# Patient Record
Sex: Female | Born: 1940
Health system: Southern US, Community
[De-identification: ages and names within clinical notes are randomized; demographics above are authoritative.]

## PROBLEM LIST (undated history)

## (undated) DIAGNOSIS — M1990 Unspecified osteoarthritis, unspecified site: Secondary | ICD-10-CM

## (undated) DIAGNOSIS — Z5189 Encounter for other specified aftercare: Secondary | ICD-10-CM

## (undated) DIAGNOSIS — C50919 Malignant neoplasm of unspecified site of unspecified female breast: Secondary | ICD-10-CM

## (undated) DIAGNOSIS — D649 Anemia, unspecified: Secondary | ICD-10-CM

## (undated) DIAGNOSIS — D126 Benign neoplasm of colon, unspecified: Secondary | ICD-10-CM

## (undated) DIAGNOSIS — C801 Malignant (primary) neoplasm, unspecified: Secondary | ICD-10-CM

## (undated) DIAGNOSIS — R011 Cardiac murmur, unspecified: Secondary | ICD-10-CM

## (undated) DIAGNOSIS — Z800 Family history of malignant neoplasm of digestive organs: Secondary | ICD-10-CM

## (undated) DIAGNOSIS — M810 Age-related osteoporosis without current pathological fracture: Secondary | ICD-10-CM

## (undated) HISTORY — PX: COLONOSCOPY: SHX174

## (undated) HISTORY — DX: Family history of malignant neoplasm of digestive organs: Z80.0

## (undated) HISTORY — PX: BREAST EXCISIONAL BIOPSY: SUR124

## (undated) HISTORY — DX: Unspecified osteoarthritis, unspecified site: M19.90

## (undated) HISTORY — DX: Benign neoplasm of colon, unspecified: D12.6

## (undated) HISTORY — DX: Malignant neoplasm of unspecified site of unspecified female breast: C50.919

## (undated) HISTORY — PX: OTHER SURGICAL HISTORY: SHX169

## (undated) HISTORY — DX: Cardiac murmur, unspecified: R01.1

## (undated) HISTORY — PX: CATARACT EXTRACTION: SUR2

## (undated) HISTORY — DX: Anemia, unspecified: D64.9

## (undated) HISTORY — PX: ABDOMINAL HYSTERECTOMY: SHX81

## (undated) HISTORY — DX: Malignant (primary) neoplasm, unspecified: C80.1

## (undated) HISTORY — DX: Age-related osteoporosis without current pathological fracture: M81.0

## (undated) HISTORY — PX: POLYPECTOMY: SHX149

## (undated) HISTORY — DX: Encounter for other specified aftercare: Z51.89

---

## 2015-08-30 DIAGNOSIS — Z0000 Encounter for general adult medical examination without abnormal findings: Secondary | ICD-10-CM | POA: Diagnosis not present

## 2015-08-30 DIAGNOSIS — Z1389 Encounter for screening for other disorder: Secondary | ICD-10-CM | POA: Diagnosis not present

## 2015-08-30 DIAGNOSIS — Z139 Encounter for screening, unspecified: Secondary | ICD-10-CM | POA: Diagnosis not present

## 2015-09-07 DIAGNOSIS — Z1231 Encounter for screening mammogram for malignant neoplasm of breast: Secondary | ICD-10-CM | POA: Diagnosis not present

## 2015-12-17 DIAGNOSIS — H2513 Age-related nuclear cataract, bilateral: Secondary | ICD-10-CM | POA: Diagnosis not present

## 2016-09-04 DIAGNOSIS — Z0000 Encounter for general adult medical examination without abnormal findings: Secondary | ICD-10-CM | POA: Diagnosis not present

## 2016-09-04 DIAGNOSIS — Z1389 Encounter for screening for other disorder: Secondary | ICD-10-CM | POA: Diagnosis not present

## 2016-09-04 DIAGNOSIS — Z139 Encounter for screening, unspecified: Secondary | ICD-10-CM | POA: Diagnosis not present

## 2017-02-08 DIAGNOSIS — H2513 Age-related nuclear cataract, bilateral: Secondary | ICD-10-CM | POA: Diagnosis not present

## 2017-02-08 DIAGNOSIS — H524 Presbyopia: Secondary | ICD-10-CM | POA: Diagnosis not present

## 2017-02-08 DIAGNOSIS — H5203 Hypermetropia, bilateral: Secondary | ICD-10-CM | POA: Diagnosis not present

## 2017-03-11 DIAGNOSIS — Z1231 Encounter for screening mammogram for malignant neoplasm of breast: Secondary | ICD-10-CM | POA: Diagnosis not present

## 2017-03-14 DIAGNOSIS — Z0000 Encounter for general adult medical examination without abnormal findings: Secondary | ICD-10-CM | POA: Diagnosis not present

## 2017-03-14 DIAGNOSIS — Z6830 Body mass index (BMI) 30.0-30.9, adult: Secondary | ICD-10-CM | POA: Diagnosis not present

## 2017-03-14 DIAGNOSIS — Z1211 Encounter for screening for malignant neoplasm of colon: Secondary | ICD-10-CM | POA: Diagnosis not present

## 2017-03-14 DIAGNOSIS — Z1231 Encounter for screening mammogram for malignant neoplasm of breast: Secondary | ICD-10-CM | POA: Diagnosis not present

## 2017-03-26 DIAGNOSIS — E782 Mixed hyperlipidemia: Secondary | ICD-10-CM | POA: Diagnosis not present

## 2017-03-26 DIAGNOSIS — Z131 Encounter for screening for diabetes mellitus: Secondary | ICD-10-CM | POA: Diagnosis not present

## 2017-03-26 DIAGNOSIS — Z13220 Encounter for screening for lipoid disorders: Secondary | ICD-10-CM | POA: Diagnosis not present

## 2017-04-16 DIAGNOSIS — Z90710 Acquired absence of both cervix and uterus: Secondary | ICD-10-CM | POA: Diagnosis not present

## 2017-04-16 DIAGNOSIS — E669 Obesity, unspecified: Secondary | ICD-10-CM | POA: Diagnosis not present

## 2017-04-16 DIAGNOSIS — Z6830 Body mass index (BMI) 30.0-30.9, adult: Secondary | ICD-10-CM | POA: Diagnosis not present

## 2017-08-13 DIAGNOSIS — C44729 Squamous cell carcinoma of skin of left lower limb, including hip: Secondary | ICD-10-CM | POA: Diagnosis not present

## 2017-08-13 DIAGNOSIS — L299 Pruritus, unspecified: Secondary | ICD-10-CM | POA: Diagnosis not present

## 2017-08-13 DIAGNOSIS — L300 Nummular dermatitis: Secondary | ICD-10-CM | POA: Diagnosis not present

## 2017-09-18 DIAGNOSIS — C44729 Squamous cell carcinoma of skin of left lower limb, including hip: Secondary | ICD-10-CM | POA: Diagnosis not present

## 2017-10-22 DIAGNOSIS — K136 Irritative hyperplasia of oral mucosa: Secondary | ICD-10-CM | POA: Diagnosis not present

## 2017-10-22 DIAGNOSIS — L439 Lichen planus, unspecified: Secondary | ICD-10-CM | POA: Diagnosis not present

## 2017-12-31 DIAGNOSIS — H2513 Age-related nuclear cataract, bilateral: Secondary | ICD-10-CM | POA: Diagnosis not present

## 2018-03-21 DIAGNOSIS — Z131 Encounter for screening for diabetes mellitus: Secondary | ICD-10-CM | POA: Diagnosis not present

## 2018-03-21 DIAGNOSIS — Z1211 Encounter for screening for malignant neoplasm of colon: Secondary | ICD-10-CM | POA: Diagnosis not present

## 2018-03-21 DIAGNOSIS — Z0000 Encounter for general adult medical examination without abnormal findings: Secondary | ICD-10-CM | POA: Diagnosis not present

## 2018-03-21 DIAGNOSIS — Z1331 Encounter for screening for depression: Secondary | ICD-10-CM | POA: Diagnosis not present

## 2018-03-21 DIAGNOSIS — Z139 Encounter for screening, unspecified: Secondary | ICD-10-CM | POA: Diagnosis not present

## 2018-03-21 DIAGNOSIS — Z13220 Encounter for screening for lipoid disorders: Secondary | ICD-10-CM | POA: Diagnosis not present

## 2018-04-02 DIAGNOSIS — Z1231 Encounter for screening mammogram for malignant neoplasm of breast: Secondary | ICD-10-CM | POA: Diagnosis not present

## 2018-05-01 DIAGNOSIS — Z139 Encounter for screening, unspecified: Secondary | ICD-10-CM | POA: Diagnosis not present

## 2018-05-01 DIAGNOSIS — M19049 Primary osteoarthritis, unspecified hand: Secondary | ICD-10-CM | POA: Diagnosis not present

## 2018-05-01 DIAGNOSIS — Z1331 Encounter for screening for depression: Secondary | ICD-10-CM | POA: Diagnosis not present

## 2018-05-01 DIAGNOSIS — M19039 Primary osteoarthritis, unspecified wrist: Secondary | ICD-10-CM | POA: Diagnosis not present

## 2018-05-14 ENCOUNTER — Encounter: Payer: Self-pay | Admitting: Family Medicine

## 2018-05-30 DIAGNOSIS — R768 Other specified abnormal immunological findings in serum: Secondary | ICD-10-CM | POA: Diagnosis not present

## 2018-05-30 DIAGNOSIS — Z6828 Body mass index (BMI) 28.0-28.9, adult: Secondary | ICD-10-CM | POA: Diagnosis not present

## 2018-06-20 DIAGNOSIS — R768 Other specified abnormal immunological findings in serum: Secondary | ICD-10-CM | POA: Diagnosis not present

## 2018-06-20 DIAGNOSIS — Z6828 Body mass index (BMI) 28.0-28.9, adult: Secondary | ICD-10-CM | POA: Diagnosis not present

## 2018-06-20 DIAGNOSIS — E663 Overweight: Secondary | ICD-10-CM | POA: Diagnosis not present

## 2018-06-25 ENCOUNTER — Encounter: Payer: Self-pay | Admitting: Gastroenterology

## 2018-07-14 ENCOUNTER — Encounter: Payer: Self-pay | Admitting: Gastroenterology

## 2018-07-17 DIAGNOSIS — Z6826 Body mass index (BMI) 26.0-26.9, adult: Secondary | ICD-10-CM | POA: Diagnosis not present

## 2018-07-17 DIAGNOSIS — E663 Overweight: Secondary | ICD-10-CM | POA: Diagnosis not present

## 2018-07-17 DIAGNOSIS — R5383 Other fatigue: Secondary | ICD-10-CM | POA: Diagnosis not present

## 2018-07-17 DIAGNOSIS — M2550 Pain in unspecified joint: Secondary | ICD-10-CM | POA: Diagnosis not present

## 2018-07-17 DIAGNOSIS — M0579 Rheumatoid arthritis with rheumatoid factor of multiple sites without organ or systems involvement: Secondary | ICD-10-CM | POA: Diagnosis not present

## 2018-08-25 ENCOUNTER — Encounter: Payer: Self-pay | Admitting: Gastroenterology

## 2018-08-29 ENCOUNTER — Other Ambulatory Visit: Payer: Self-pay

## 2018-08-29 ENCOUNTER — Ambulatory Visit (AMBULATORY_SURGERY_CENTER): Payer: Self-pay | Admitting: *Deleted

## 2018-08-29 VITALS — Ht 62.0 in | Wt 142.9 lb

## 2018-08-29 DIAGNOSIS — Z86010 Personal history of colonic polyps: Secondary | ICD-10-CM

## 2018-08-29 DIAGNOSIS — Z800 Family history of malignant neoplasm of digestive organs: Secondary | ICD-10-CM

## 2018-08-29 NOTE — Progress Notes (Signed)
No egg or soy allergy known to patient  No issues with past sedation with any surgeries  or procedures, no intubation problems  No diet pills per patient No home 02 use per patient  No blood thinners per patient  Pt denies issues with constipation  No A fib or A flutter  Patient very concerned regarding Colonoscopy is being scheduled prior to 5 year. Patient stating her PCP arranged this. Dicussed with My team leader, Lenard Galloway RN. Left  up to the patient whether to go forward and complete the PV and await Precert decision or patient to reschedule for 03/2019. Patient stating no symptoms and will report any new symptoms. Patient opting to cancel procedure scheduled for 09/09/18 and reschedule. Recall placed in the system for 03/04/2019

## 2018-09-09 ENCOUNTER — Encounter: Payer: Self-pay | Admitting: Gastroenterology

## 2018-09-22 DIAGNOSIS — M0579 Rheumatoid arthritis with rheumatoid factor of multiple sites without organ or systems involvement: Secondary | ICD-10-CM | POA: Diagnosis not present

## 2018-09-22 DIAGNOSIS — E663 Overweight: Secondary | ICD-10-CM | POA: Diagnosis not present

## 2018-09-22 DIAGNOSIS — Z6825 Body mass index (BMI) 25.0-25.9, adult: Secondary | ICD-10-CM | POA: Diagnosis not present

## 2018-11-10 DIAGNOSIS — R634 Abnormal weight loss: Secondary | ICD-10-CM | POA: Diagnosis not present

## 2018-11-10 DIAGNOSIS — Z6824 Body mass index (BMI) 24.0-24.9, adult: Secondary | ICD-10-CM | POA: Diagnosis not present

## 2018-11-10 DIAGNOSIS — M0579 Rheumatoid arthritis with rheumatoid factor of multiple sites without organ or systems involvement: Secondary | ICD-10-CM | POA: Diagnosis not present

## 2019-01-13 DIAGNOSIS — H2513 Age-related nuclear cataract, bilateral: Secondary | ICD-10-CM | POA: Diagnosis not present

## 2019-01-13 DIAGNOSIS — H524 Presbyopia: Secondary | ICD-10-CM | POA: Diagnosis not present

## 2019-02-09 DIAGNOSIS — Z6823 Body mass index (BMI) 23.0-23.9, adult: Secondary | ICD-10-CM | POA: Diagnosis not present

## 2019-02-09 DIAGNOSIS — M0579 Rheumatoid arthritis with rheumatoid factor of multiple sites without organ or systems involvement: Secondary | ICD-10-CM | POA: Diagnosis not present

## 2019-02-09 DIAGNOSIS — R634 Abnormal weight loss: Secondary | ICD-10-CM | POA: Diagnosis not present

## 2019-04-22 DIAGNOSIS — L853 Xerosis cutis: Secondary | ICD-10-CM | POA: Diagnosis not present

## 2019-04-22 DIAGNOSIS — L601 Onycholysis: Secondary | ICD-10-CM | POA: Diagnosis not present

## 2019-04-24 ENCOUNTER — Encounter: Payer: Self-pay | Admitting: Gastroenterology

## 2019-05-05 ENCOUNTER — Ambulatory Visit: Payer: Medicare PPO | Admitting: *Deleted

## 2019-05-05 ENCOUNTER — Other Ambulatory Visit: Payer: Self-pay

## 2019-05-05 VITALS — Ht 62.0 in | Wt 128.0 lb

## 2019-05-05 DIAGNOSIS — Z86010 Personal history of colonic polyps: Secondary | ICD-10-CM

## 2019-05-05 DIAGNOSIS — Z800 Family history of malignant neoplasm of digestive organs: Secondary | ICD-10-CM

## 2019-05-05 MED ORDER — NA SULFATE-K SULFATE-MG SULF 17.5-3.13-1.6 GM/177ML PO SOLN: 1.0000 | Freq: Once | ORAL | 0 refills | Status: AC

## 2019-05-05 NOTE — Progress Notes (Signed)
No egg or soy allergy known to patient  No issues with past sedation with any surgeries  or procedures, no past  intubation   No diet pills per patient No home 02 use per patient  No blood thinners per patient  Pt denies issues with constipation  No A fib or A flutter  EMMI video sent to pt's e mail    Pt verified name, DOB, address and insurance during PV today. Pt mailed instruction packet to included paper to complete and mail back to [facility] with addressed and stamped envelope, Emmi video, copy of consent form to read and not return, and instructions. . PV completed over the phone. Pt encouraged to call with questions or issues

## 2019-05-08 DIAGNOSIS — H01139 Eczematous dermatitis of unspecified eye, unspecified eyelid: Secondary | ICD-10-CM | POA: Diagnosis not present

## 2019-05-18 DIAGNOSIS — R634 Abnormal weight loss: Secondary | ICD-10-CM | POA: Diagnosis not present

## 2019-05-18 DIAGNOSIS — M0579 Rheumatoid arthritis with rheumatoid factor of multiple sites without organ or systems involvement: Secondary | ICD-10-CM | POA: Diagnosis not present

## 2019-05-18 DIAGNOSIS — Z6823 Body mass index (BMI) 23.0-23.9, adult: Secondary | ICD-10-CM | POA: Diagnosis not present

## 2019-05-20 DIAGNOSIS — Z1231 Encounter for screening mammogram for malignant neoplasm of breast: Secondary | ICD-10-CM | POA: Diagnosis not present

## 2019-05-21 ENCOUNTER — Telehealth: Payer: Self-pay | Admitting: Internal Medicine

## 2019-05-21 NOTE — Telephone Encounter (Signed)

## 2019-05-21 NOTE — Telephone Encounter (Signed)
Patient called back and answered no to all covid-19 screening questions.

## 2019-05-22 ENCOUNTER — Encounter: Payer: Self-pay | Admitting: Gastroenterology

## 2019-05-22 ENCOUNTER — Ambulatory Visit (AMBULATORY_SURGERY_CENTER): Payer: Medicare PPO | Admitting: Gastroenterology

## 2019-05-22 ENCOUNTER — Other Ambulatory Visit: Payer: Self-pay

## 2019-05-22 VITALS — BP 109/52 | HR 59 | Temp 98.1°F | Resp 15 | Ht 62.0 in | Wt 128.0 lb

## 2019-05-22 DIAGNOSIS — Z800 Family history of malignant neoplasm of digestive organs: Secondary | ICD-10-CM

## 2019-05-22 DIAGNOSIS — M069 Rheumatoid arthritis, unspecified: Secondary | ICD-10-CM | POA: Diagnosis not present

## 2019-05-22 DIAGNOSIS — Z86010 Personal history of colonic polyps: Secondary | ICD-10-CM

## 2019-05-22 DIAGNOSIS — D122 Benign neoplasm of ascending colon: Secondary | ICD-10-CM | POA: Diagnosis not present

## 2019-05-22 MED ORDER — SODIUM CHLORIDE 0.9 % IV SOLN: 500.0000 mL | Freq: Once | INTRAVENOUS | Status: DC

## 2019-05-22 NOTE — Op Note (Signed)
[facility] Patient Name: Andrea Cunningham Procedure Date: 05/22/2019 8:17 AM MRN: [id number] Endoscopist: Jackquline Denmark , MD Age: 78 Referring MD:  Date of Birth: 02-Sep-1941 Gender: Female Account #: [id number] Procedure:                Colonoscopy Indications:              Family history of colon cancer (mother,                            grandfather, aunt and uncle, all around age 68) Medicines:                Monitored Anesthesia Care Procedure:                Pre-Anesthesia Assessment:                           - Prior to the procedure, a History and Physical                            was performed, and patient medications and                            allergies were reviewed. The patient's tolerance of                            previous anesthesia was also reviewed. The risks                            and benefits of the procedure and the sedation                            options and risks were discussed with the patient.                            All questions were answered, and informed consent                            was obtained. Prior Anticoagulants: The patient has                            taken no previous anticoagulant or antiplatelet                            agents. ASA Grade Assessment: II - A patient with                            mild systemic disease. After reviewing the risks                            and benefits, the patient was deemed in                            satisfactory condition to undergo the procedure.  After obtaining informed consent, the colonoscope                            was passed under direct vision. Throughout the                            procedure, the patient's blood pressure, pulse, and                            oxygen saturations were monitored continuously. The                            Model PCF-H190DL [id number]) scope was introduced                            through the anus and  advanced to the the cecum,                            identified by appendiceal orifice and ileocecal                            valve. The colonoscopy was performed without                            difficulty. The patient tolerated the procedure                            well. The quality of the bowel preparation was good. Scope In: 8:27:53 AM Scope Out: 8:45:47 AM Scope Withdrawal Time: 0 hours 11 minutes 28 seconds  Total Procedure Duration: 0 hours 17 minutes 54 seconds  Findings:                 Two sessile polyps were found in the proximal                            ascending colon. The polyps were 6 to 8 mm in size.                            These polyps were removed with a cold snare.                            Resection and retrieval were complete. Estimated                            blood loss: none.                           Multiple small-mouthed diverticula were found in                            the sigmoid colon.                           Non-bleeding internal hemorrhoids were found during  retroflexion. The hemorrhoids were small.                           The exam was otherwise without abnormality. Complications:            No immediate complications. Estimated Blood Loss:     Estimated blood loss: none. Impression:               -Colonic polyps status post polypectomy.                           -Moderate sigmoid diverticulosis.                           -Otherwise normal colonoscopy. Colon was somewhat                            redundant. Recommendation:           - Patient has a contact number available for                            emergencies. The signs and symptoms of potential                            delayed complications were discussed with the                            patient. Return to normal activities tomorrow.                            Written discharge instructions were provided to the                             patient.                           - High fiber diet.                           - Continue present medications.                           - Await pathology results.                           - Repeat colonoscopy for surveillance based on                            pathology results.                           - Return to GI office PRN. Jackquline Denmark, MD 05/22/2019 8:53:29 AM This report has been signed electronically.

## 2019-05-22 NOTE — Progress Notes (Signed)
Pt's states no medical or surgical changes since previsit or office visit.  Temps taken by CW V/S taken by [place]

## 2019-05-22 NOTE — Patient Instructions (Signed)
   INFORMATION ON POLYPS ,DIVERTICULOSIS ,HEMORRHOIDS ,& HIGH FIBER DIET GIVEN TO YOU TODAY.  AWAIT PATHOLOGY ON POLYPS REMOVED    YOU HAD AN ENDOSCOPIC PROCEDURE TODAY AT [facility]:   Refer to the procedure report that was given to you for any specific questions about what was found during the examination.  If the procedure report does not answer your questions, please call your gastroenterologist to clarify.  If you requested that your care partner not be given the details of your procedure findings, then the procedure report has been included in a sealed envelope for you to review at your convenience later.  YOU SHOULD EXPECT: Some feelings of bloating in the abdomen. Passage of more gas than usual.  Walking can help get rid of the air that was put into your GI tract during the procedure and reduce the bloating. If you had a lower endoscopy (such as a colonoscopy or flexible sigmoidoscopy) you may notice spotting of blood in your stool or on the toilet paper. If you underwent a bowel prep for your procedure, you may not have a normal bowel movement for a few days.  Please Note:  You might notice some irritation and congestion in your nose or some drainage.  This is from the oxygen used during your procedure.  There is no need for concern and it should clear up in a day or so.  SYMPTOMS TO REPORT IMMEDIATELY:   Following lower endoscopy (colonoscopy or flexible sigmoidoscopy):  Excessive amounts of blood in the stool  Significant tenderness or worsening of abdominal pains  Swelling of the abdomen that is new, acute  Fever of 100F or higher    For urgent or emergent issues, a gastroenterologist can be reached at any hour by calling [phone].   DIET:  We do recommend a small meal at first, but then you may proceed to your regular diet.  Drink plenty of fluids but you should avoid alcoholic beverages for 24 hours.  ACTIVITY:  You should plan to take it easy for  the rest of today and you should NOT DRIVE or use heavy machinery until tomorrow (because of the sedation medicines used during the test).    FOLLOW UP: Our staff will call the number listed on your records 48-72 hours following your procedure to check on you and address any questions or concerns that you may have regarding the information given to you following your procedure. If we do not reach you, we will leave a message.  We will attempt to reach you two times.  During this call, we will ask if you have developed any symptoms of COVID 19. If you develop any symptoms (ie: fever, flu-like symptoms, shortness of breath, cough etc.) before then, please call [phone].  If you test positive for Covid 19 in the 2 weeks post procedure, please call and report this information to [place].    If any biopsies were taken you will be contacted by phone or by letter within the next 1-3 weeks.  Please call us at [phone] if you have not heard about the biopsies in 3 weeks.    SIGNATURES/CONFIDENTIALITY: You and/or your care partner have signed paperwork which will be entered into your electronic medical record.  These signatures attest to the fact that that the information above on your After Visit Summary has been reviewed and is understood.  Full responsibility of the confidentiality of this discharge information lies with you and/or your care-partner.

## 2019-05-22 NOTE — Progress Notes (Signed)
A and O x3. Report to RN. Tolerated MAC anesthesia well.

## 2019-05-22 NOTE — Progress Notes (Signed)
Called to room to assist during endoscopic procedure.  Patient ID and intended procedure confirmed with present staff. Received instructions for my participation in the procedure from the performing physician.  

## 2019-05-26 ENCOUNTER — Telehealth: Payer: Self-pay | Admitting: *Deleted

## 2019-05-26 NOTE — Telephone Encounter (Signed)
  Follow up Call-  Call back number 05/22/2019  Post procedure Call Back phone  # [phone]  Permission to leave phone message Yes  Some recent data might be hidden     Patient questions:  Do you have a fever, pain , or abdominal swelling? No. Pain Score  0 *  Have you tolerated food without any problems? Yes.    Have you been able to return to your normal activities? No.  Do you have any questions about your discharge instructions: Diet   No. Medications  No. Follow up visit  No.  Do you have questions or concerns about your Care? No.  Actions: * If pain score is 4 or above: No action needed, pain <4.  1. Have you developed a fever since your procedure? no  2.   Have you had an respiratory symptoms (SOB or cough) since your procedure? no  3.   Have you tested positive for COVID 19 since your procedure no  4.   Have you had any family members/close contacts diagnosed with the COVID 19 since your procedure?  no   If yes to any of these questions please route to Joylene John, RN and Alphonsa Gin, Therapist, sports.

## 2019-06-01 ENCOUNTER — Encounter: Payer: Self-pay | Admitting: Gastroenterology

## 2019-08-18 DIAGNOSIS — Z6823 Body mass index (BMI) 23.0-23.9, adult: Secondary | ICD-10-CM | POA: Diagnosis not present

## 2019-08-18 DIAGNOSIS — M0579 Rheumatoid arthritis with rheumatoid factor of multiple sites without organ or systems involvement: Secondary | ICD-10-CM | POA: Diagnosis not present

## 2019-11-29 DIAGNOSIS — D849 Immunodeficiency, unspecified: Secondary | ICD-10-CM | POA: Diagnosis not present

## 2019-11-29 DIAGNOSIS — U071 COVID-19: Secondary | ICD-10-CM | POA: Diagnosis not present

## 2019-11-29 DIAGNOSIS — D61818 Other pancytopenia: Secondary | ICD-10-CM | POA: Diagnosis not present

## 2019-11-29 DIAGNOSIS — R531 Weakness: Secondary | ICD-10-CM | POA: Diagnosis not present

## 2019-11-29 DIAGNOSIS — D72810 Lymphocytopenia: Secondary | ICD-10-CM | POA: Diagnosis not present

## 2019-11-29 DIAGNOSIS — J189 Pneumonia, unspecified organism: Secondary | ICD-10-CM | POA: Diagnosis not present

## 2019-11-29 DIAGNOSIS — J1289 Other viral pneumonia: Secondary | ICD-10-CM | POA: Diagnosis not present

## 2019-11-29 DIAGNOSIS — Z8719 Personal history of other diseases of the digestive system: Secondary | ICD-10-CM | POA: Diagnosis not present

## 2019-11-29 DIAGNOSIS — E871 Hypo-osmolality and hyponatremia: Secondary | ICD-10-CM | POA: Diagnosis not present

## 2019-11-29 DIAGNOSIS — J984 Other disorders of lung: Secondary | ICD-10-CM | POA: Diagnosis not present

## 2019-11-29 DIAGNOSIS — J9601 Acute respiratory failure with hypoxia: Secondary | ICD-10-CM | POA: Diagnosis not present

## 2019-11-29 DIAGNOSIS — M069 Rheumatoid arthritis, unspecified: Secondary | ICD-10-CM | POA: Diagnosis not present

## 2019-11-29 DIAGNOSIS — R55 Syncope and collapse: Secondary | ICD-10-CM | POA: Diagnosis not present

## 2019-11-29 DIAGNOSIS — D696 Thrombocytopenia, unspecified: Secondary | ICD-10-CM | POA: Diagnosis not present

## 2019-11-29 DIAGNOSIS — Z79899 Other long term (current) drug therapy: Secondary | ICD-10-CM | POA: Diagnosis not present

## 2019-11-29 DIAGNOSIS — Z90710 Acquired absence of both cervix and uterus: Secondary | ICD-10-CM | POA: Diagnosis not present

## 2019-11-30 DIAGNOSIS — D696 Thrombocytopenia, unspecified: Secondary | ICD-10-CM | POA: Diagnosis not present

## 2019-11-30 DIAGNOSIS — E871 Hypo-osmolality and hyponatremia: Secondary | ICD-10-CM | POA: Diagnosis not present

## 2019-11-30 DIAGNOSIS — Z90710 Acquired absence of both cervix and uterus: Secondary | ICD-10-CM | POA: Diagnosis not present

## 2019-11-30 DIAGNOSIS — J9601 Acute respiratory failure with hypoxia: Secondary | ICD-10-CM | POA: Diagnosis not present

## 2019-11-30 DIAGNOSIS — D849 Immunodeficiency, unspecified: Secondary | ICD-10-CM | POA: Diagnosis not present

## 2019-11-30 DIAGNOSIS — J189 Pneumonia, unspecified organism: Secondary | ICD-10-CM | POA: Diagnosis not present

## 2019-11-30 DIAGNOSIS — D61818 Other pancytopenia: Secondary | ICD-10-CM | POA: Diagnosis not present

## 2019-11-30 DIAGNOSIS — U071 COVID-19: Secondary | ICD-10-CM | POA: Diagnosis not present

## 2019-11-30 DIAGNOSIS — D72810 Lymphocytopenia: Secondary | ICD-10-CM | POA: Diagnosis not present

## 2019-11-30 DIAGNOSIS — Z79899 Other long term (current) drug therapy: Secondary | ICD-10-CM | POA: Diagnosis not present

## 2019-11-30 DIAGNOSIS — M069 Rheumatoid arthritis, unspecified: Secondary | ICD-10-CM | POA: Diagnosis not present

## 2019-11-30 DIAGNOSIS — J1289 Other viral pneumonia: Secondary | ICD-10-CM | POA: Diagnosis not present

## 2019-12-01 DIAGNOSIS — Z90710 Acquired absence of both cervix and uterus: Secondary | ICD-10-CM | POA: Diagnosis not present

## 2019-12-01 DIAGNOSIS — U071 COVID-19: Secondary | ICD-10-CM | POA: Diagnosis not present

## 2019-12-01 DIAGNOSIS — J9601 Acute respiratory failure with hypoxia: Secondary | ICD-10-CM | POA: Diagnosis not present

## 2019-12-01 DIAGNOSIS — D61818 Other pancytopenia: Secondary | ICD-10-CM | POA: Diagnosis not present

## 2019-12-01 DIAGNOSIS — D849 Immunodeficiency, unspecified: Secondary | ICD-10-CM | POA: Diagnosis not present

## 2019-12-01 DIAGNOSIS — D72810 Lymphocytopenia: Secondary | ICD-10-CM | POA: Diagnosis not present

## 2019-12-01 DIAGNOSIS — E871 Hypo-osmolality and hyponatremia: Secondary | ICD-10-CM | POA: Diagnosis not present

## 2019-12-01 DIAGNOSIS — D696 Thrombocytopenia, unspecified: Secondary | ICD-10-CM | POA: Diagnosis not present

## 2019-12-01 DIAGNOSIS — M069 Rheumatoid arthritis, unspecified: Secondary | ICD-10-CM | POA: Diagnosis not present

## 2019-12-01 DIAGNOSIS — J069 Acute upper respiratory infection, unspecified: Secondary | ICD-10-CM | POA: Diagnosis not present

## 2019-12-01 DIAGNOSIS — J1289 Other viral pneumonia: Secondary | ICD-10-CM | POA: Diagnosis not present

## 2019-12-01 DIAGNOSIS — Z79899 Other long term (current) drug therapy: Secondary | ICD-10-CM | POA: Diagnosis not present

## 2019-12-18 DIAGNOSIS — Z136 Encounter for screening for cardiovascular disorders: Secondary | ICD-10-CM | POA: Diagnosis not present

## 2019-12-18 DIAGNOSIS — M069 Rheumatoid arthritis, unspecified: Secondary | ICD-10-CM | POA: Diagnosis not present

## 2019-12-18 DIAGNOSIS — Z7689 Persons encountering health services in other specified circumstances: Secondary | ICD-10-CM | POA: Diagnosis not present

## 2019-12-18 DIAGNOSIS — U071 COVID-19: Secondary | ICD-10-CM | POA: Diagnosis not present

## 2019-12-18 DIAGNOSIS — Z1331 Encounter for screening for depression: Secondary | ICD-10-CM | POA: Diagnosis not present

## 2019-12-18 DIAGNOSIS — Z0000 Encounter for general adult medical examination without abnormal findings: Secondary | ICD-10-CM | POA: Diagnosis not present

## 2019-12-18 DIAGNOSIS — Z1339 Encounter for screening examination for other mental health and behavioral disorders: Secondary | ICD-10-CM | POA: Diagnosis not present

## 2019-12-18 DIAGNOSIS — Z7189 Other specified counseling: Secondary | ICD-10-CM | POA: Diagnosis not present

## 2019-12-18 DIAGNOSIS — Z139 Encounter for screening, unspecified: Secondary | ICD-10-CM | POA: Diagnosis not present

## 2020-02-05 DIAGNOSIS — G8929 Other chronic pain: Secondary | ICD-10-CM | POA: Diagnosis not present

## 2020-02-05 DIAGNOSIS — Z85828 Personal history of other malignant neoplasm of skin: Secondary | ICD-10-CM | POA: Diagnosis not present

## 2020-02-05 DIAGNOSIS — M069 Rheumatoid arthritis, unspecified: Secondary | ICD-10-CM | POA: Diagnosis not present

## 2020-02-05 DIAGNOSIS — Z809 Family history of malignant neoplasm, unspecified: Secondary | ICD-10-CM | POA: Diagnosis not present

## 2020-02-16 DIAGNOSIS — M0579 Rheumatoid arthritis with rheumatoid factor of multiple sites without organ or systems involvement: Secondary | ICD-10-CM | POA: Diagnosis not present

## 2020-02-16 DIAGNOSIS — Z6821 Body mass index (BMI) 21.0-21.9, adult: Secondary | ICD-10-CM | POA: Diagnosis not present

## 2020-02-26 DIAGNOSIS — H25811 Combined forms of age-related cataract, right eye: Secondary | ICD-10-CM | POA: Diagnosis not present

## 2020-02-26 DIAGNOSIS — Z01818 Encounter for other preprocedural examination: Secondary | ICD-10-CM | POA: Diagnosis not present

## 2020-02-26 DIAGNOSIS — H353131 Nonexudative age-related macular degeneration, bilateral, early dry stage: Secondary | ICD-10-CM | POA: Diagnosis not present

## 2020-02-26 DIAGNOSIS — H53001 Unspecified amblyopia, right eye: Secondary | ICD-10-CM | POA: Diagnosis not present

## 2020-03-15 DIAGNOSIS — Z79899 Other long term (current) drug therapy: Secondary | ICD-10-CM | POA: Diagnosis not present

## 2020-03-15 DIAGNOSIS — H53001 Unspecified amblyopia, right eye: Secondary | ICD-10-CM | POA: Diagnosis not present

## 2020-03-15 DIAGNOSIS — H25811 Combined forms of age-related cataract, right eye: Secondary | ICD-10-CM | POA: Diagnosis not present

## 2020-03-15 DIAGNOSIS — H40003 Preglaucoma, unspecified, bilateral: Secondary | ICD-10-CM | POA: Diagnosis not present

## 2020-03-15 DIAGNOSIS — H259 Unspecified age-related cataract: Secondary | ICD-10-CM | POA: Diagnosis not present

## 2020-03-15 DIAGNOSIS — Z7952 Long term (current) use of systemic steroids: Secondary | ICD-10-CM | POA: Diagnosis not present

## 2020-03-15 DIAGNOSIS — M069 Rheumatoid arthritis, unspecified: Secondary | ICD-10-CM | POA: Diagnosis not present

## 2020-04-11 DIAGNOSIS — E782 Mixed hyperlipidemia: Secondary | ICD-10-CM | POA: Diagnosis not present

## 2020-04-11 DIAGNOSIS — M069 Rheumatoid arthritis, unspecified: Secondary | ICD-10-CM | POA: Diagnosis not present

## 2020-04-11 DIAGNOSIS — Z6821 Body mass index (BMI) 21.0-21.9, adult: Secondary | ICD-10-CM | POA: Diagnosis not present

## 2020-04-12 DIAGNOSIS — H259 Unspecified age-related cataract: Secondary | ICD-10-CM | POA: Diagnosis not present

## 2020-04-12 DIAGNOSIS — Z7952 Long term (current) use of systemic steroids: Secondary | ICD-10-CM | POA: Diagnosis not present

## 2020-04-12 DIAGNOSIS — H25812 Combined forms of age-related cataract, left eye: Secondary | ICD-10-CM | POA: Diagnosis not present

## 2020-04-20 DIAGNOSIS — Z6821 Body mass index (BMI) 21.0-21.9, adult: Secondary | ICD-10-CM | POA: Diagnosis not present

## 2020-04-20 DIAGNOSIS — M0579 Rheumatoid arthritis with rheumatoid factor of multiple sites without organ or systems involvement: Secondary | ICD-10-CM | POA: Diagnosis not present

## 2020-05-04 DIAGNOSIS — M0579 Rheumatoid arthritis with rheumatoid factor of multiple sites without organ or systems involvement: Secondary | ICD-10-CM | POA: Diagnosis not present

## 2020-05-04 DIAGNOSIS — Z6823 Body mass index (BMI) 23.0-23.9, adult: Secondary | ICD-10-CM | POA: Diagnosis not present

## 2020-05-12 DIAGNOSIS — R69 Illness, unspecified: Secondary | ICD-10-CM | POA: Diagnosis not present

## 2020-05-17 DIAGNOSIS — R69 Illness, unspecified: Secondary | ICD-10-CM | POA: Diagnosis not present

## 2020-05-31 DIAGNOSIS — R69 Illness, unspecified: Secondary | ICD-10-CM | POA: Diagnosis not present

## 2020-06-01 DIAGNOSIS — E782 Mixed hyperlipidemia: Secondary | ICD-10-CM | POA: Diagnosis not present

## 2020-06-01 DIAGNOSIS — M069 Rheumatoid arthritis, unspecified: Secondary | ICD-10-CM | POA: Diagnosis not present

## 2020-06-10 DIAGNOSIS — E782 Mixed hyperlipidemia: Secondary | ICD-10-CM | POA: Diagnosis not present

## 2020-06-17 DIAGNOSIS — Z139 Encounter for screening, unspecified: Secondary | ICD-10-CM | POA: Diagnosis not present

## 2020-06-17 DIAGNOSIS — M069 Rheumatoid arthritis, unspecified: Secondary | ICD-10-CM | POA: Diagnosis not present

## 2020-06-17 DIAGNOSIS — E782 Mixed hyperlipidemia: Secondary | ICD-10-CM | POA: Diagnosis not present

## 2020-06-17 DIAGNOSIS — Z6822 Body mass index (BMI) 22.0-22.9, adult: Secondary | ICD-10-CM | POA: Diagnosis not present

## 2020-06-28 DIAGNOSIS — M0579 Rheumatoid arthritis with rheumatoid factor of multiple sites without organ or systems involvement: Secondary | ICD-10-CM | POA: Diagnosis not present

## 2020-06-28 DIAGNOSIS — R7989 Other specified abnormal findings of blood chemistry: Secondary | ICD-10-CM | POA: Diagnosis not present

## 2020-06-28 DIAGNOSIS — Z6822 Body mass index (BMI) 22.0-22.9, adult: Secondary | ICD-10-CM | POA: Diagnosis not present

## 2020-06-29 DIAGNOSIS — Z1231 Encounter for screening mammogram for malignant neoplasm of breast: Secondary | ICD-10-CM | POA: Diagnosis not present

## 2020-07-03 DIAGNOSIS — M069 Rheumatoid arthritis, unspecified: Secondary | ICD-10-CM | POA: Diagnosis not present

## 2020-07-03 DIAGNOSIS — E782 Mixed hyperlipidemia: Secondary | ICD-10-CM | POA: Diagnosis not present

## 2020-08-02 DIAGNOSIS — M069 Rheumatoid arthritis, unspecified: Secondary | ICD-10-CM | POA: Diagnosis not present

## 2020-08-02 DIAGNOSIS — E782 Mixed hyperlipidemia: Secondary | ICD-10-CM | POA: Diagnosis not present

## 2020-08-12 DIAGNOSIS — R69 Illness, unspecified: Secondary | ICD-10-CM | POA: Diagnosis not present

## 2020-09-02 DIAGNOSIS — M069 Rheumatoid arthritis, unspecified: Secondary | ICD-10-CM | POA: Diagnosis not present

## 2020-09-02 DIAGNOSIS — E782 Mixed hyperlipidemia: Secondary | ICD-10-CM | POA: Diagnosis not present

## 2020-09-26 DIAGNOSIS — R69 Illness, unspecified: Secondary | ICD-10-CM | POA: Diagnosis not present

## 2020-10-03 DIAGNOSIS — M069 Rheumatoid arthritis, unspecified: Secondary | ICD-10-CM | POA: Diagnosis not present

## 2020-10-03 DIAGNOSIS — Z6824 Body mass index (BMI) 24.0-24.9, adult: Secondary | ICD-10-CM | POA: Diagnosis not present

## 2020-10-03 DIAGNOSIS — E782 Mixed hyperlipidemia: Secondary | ICD-10-CM | POA: Diagnosis not present

## 2020-10-03 DIAGNOSIS — R7989 Other specified abnormal findings of blood chemistry: Secondary | ICD-10-CM | POA: Diagnosis not present

## 2020-10-03 DIAGNOSIS — M0579 Rheumatoid arthritis with rheumatoid factor of multiple sites without organ or systems involvement: Secondary | ICD-10-CM | POA: Diagnosis not present

## 2020-11-02 DIAGNOSIS — M069 Rheumatoid arthritis, unspecified: Secondary | ICD-10-CM | POA: Diagnosis not present

## 2020-11-02 DIAGNOSIS — E782 Mixed hyperlipidemia: Secondary | ICD-10-CM | POA: Diagnosis not present

## 2020-11-11 DIAGNOSIS — E782 Mixed hyperlipidemia: Secondary | ICD-10-CM | POA: Diagnosis not present

## 2020-11-18 DIAGNOSIS — D849 Immunodeficiency, unspecified: Secondary | ICD-10-CM | POA: Diagnosis not present

## 2020-11-18 DIAGNOSIS — E782 Mixed hyperlipidemia: Secondary | ICD-10-CM | POA: Diagnosis not present

## 2020-11-18 DIAGNOSIS — M069 Rheumatoid arthritis, unspecified: Secondary | ICD-10-CM | POA: Diagnosis not present

## 2020-11-18 DIAGNOSIS — Z6825 Body mass index (BMI) 25.0-25.9, adult: Secondary | ICD-10-CM | POA: Diagnosis not present

## 2020-12-03 DIAGNOSIS — E782 Mixed hyperlipidemia: Secondary | ICD-10-CM | POA: Diagnosis not present

## 2020-12-03 DIAGNOSIS — M069 Rheumatoid arthritis, unspecified: Secondary | ICD-10-CM | POA: Diagnosis not present

## 2021-01-03 DIAGNOSIS — E782 Mixed hyperlipidemia: Secondary | ICD-10-CM | POA: Diagnosis not present

## 2021-01-03 DIAGNOSIS — M069 Rheumatoid arthritis, unspecified: Secondary | ICD-10-CM | POA: Diagnosis not present

## 2021-01-04 DIAGNOSIS — Z6825 Body mass index (BMI) 25.0-25.9, adult: Secondary | ICD-10-CM | POA: Diagnosis not present

## 2021-01-04 DIAGNOSIS — M0579 Rheumatoid arthritis with rheumatoid factor of multiple sites without organ or systems involvement: Secondary | ICD-10-CM | POA: Diagnosis not present

## 2021-01-04 DIAGNOSIS — R7989 Other specified abnormal findings of blood chemistry: Secondary | ICD-10-CM | POA: Diagnosis not present

## 2021-01-04 DIAGNOSIS — E663 Overweight: Secondary | ICD-10-CM | POA: Diagnosis not present

## 2021-01-23 DIAGNOSIS — Z85828 Personal history of other malignant neoplasm of skin: Secondary | ICD-10-CM | POA: Diagnosis not present

## 2021-01-23 DIAGNOSIS — R030 Elevated blood-pressure reading, without diagnosis of hypertension: Secondary | ICD-10-CM | POA: Diagnosis not present

## 2021-01-23 DIAGNOSIS — M069 Rheumatoid arthritis, unspecified: Secondary | ICD-10-CM | POA: Diagnosis not present

## 2021-01-23 DIAGNOSIS — Z809 Family history of malignant neoplasm, unspecified: Secondary | ICD-10-CM | POA: Diagnosis not present

## 2021-01-23 DIAGNOSIS — D84821 Immunodeficiency due to drugs: Secondary | ICD-10-CM | POA: Diagnosis not present

## 2021-01-23 DIAGNOSIS — Z79899 Other long term (current) drug therapy: Secondary | ICD-10-CM | POA: Diagnosis not present

## 2021-01-31 DIAGNOSIS — E782 Mixed hyperlipidemia: Secondary | ICD-10-CM | POA: Diagnosis not present

## 2021-01-31 DIAGNOSIS — M069 Rheumatoid arthritis, unspecified: Secondary | ICD-10-CM | POA: Diagnosis not present

## 2021-01-31 DIAGNOSIS — M19049 Primary osteoarthritis, unspecified hand: Secondary | ICD-10-CM | POA: Diagnosis not present

## 2021-03-03 DIAGNOSIS — E782 Mixed hyperlipidemia: Secondary | ICD-10-CM | POA: Diagnosis not present

## 2021-03-03 DIAGNOSIS — M069 Rheumatoid arthritis, unspecified: Secondary | ICD-10-CM | POA: Diagnosis not present

## 2021-04-02 DIAGNOSIS — E782 Mixed hyperlipidemia: Secondary | ICD-10-CM | POA: Diagnosis not present

## 2021-04-02 DIAGNOSIS — M069 Rheumatoid arthritis, unspecified: Secondary | ICD-10-CM | POA: Diagnosis not present

## 2021-05-03 DIAGNOSIS — M069 Rheumatoid arthritis, unspecified: Secondary | ICD-10-CM | POA: Diagnosis not present

## 2021-05-03 DIAGNOSIS — M19049 Primary osteoarthritis, unspecified hand: Secondary | ICD-10-CM | POA: Diagnosis not present

## 2021-05-03 DIAGNOSIS — E782 Mixed hyperlipidemia: Secondary | ICD-10-CM | POA: Diagnosis not present

## 2021-05-10 DIAGNOSIS — E782 Mixed hyperlipidemia: Secondary | ICD-10-CM | POA: Diagnosis not present

## 2021-05-22 DIAGNOSIS — Z0000 Encounter for general adult medical examination without abnormal findings: Secondary | ICD-10-CM | POA: Diagnosis not present

## 2021-05-22 DIAGNOSIS — Z1331 Encounter for screening for depression: Secondary | ICD-10-CM | POA: Diagnosis not present

## 2021-05-22 DIAGNOSIS — Z136 Encounter for screening for cardiovascular disorders: Secondary | ICD-10-CM | POA: Diagnosis not present

## 2021-05-22 DIAGNOSIS — Z1339 Encounter for screening examination for other mental health and behavioral disorders: Secondary | ICD-10-CM | POA: Diagnosis not present

## 2021-05-22 DIAGNOSIS — W540XXA Bitten by dog, initial encounter: Secondary | ICD-10-CM | POA: Diagnosis not present

## 2021-05-22 DIAGNOSIS — Z139 Encounter for screening, unspecified: Secondary | ICD-10-CM | POA: Diagnosis not present

## 2021-05-22 DIAGNOSIS — M069 Rheumatoid arthritis, unspecified: Secondary | ICD-10-CM | POA: Diagnosis not present

## 2021-05-22 DIAGNOSIS — E782 Mixed hyperlipidemia: Secondary | ICD-10-CM | POA: Diagnosis not present

## 2021-05-22 DIAGNOSIS — Z23 Encounter for immunization: Secondary | ICD-10-CM | POA: Diagnosis not present

## 2021-05-22 DIAGNOSIS — R55 Syncope and collapse: Secondary | ICD-10-CM | POA: Diagnosis not present

## 2021-05-31 DIAGNOSIS — H353131 Nonexudative age-related macular degeneration, bilateral, early dry stage: Secondary | ICD-10-CM | POA: Diagnosis not present

## 2021-06-02 DIAGNOSIS — M19049 Primary osteoarthritis, unspecified hand: Secondary | ICD-10-CM | POA: Diagnosis not present

## 2021-06-02 DIAGNOSIS — M069 Rheumatoid arthritis, unspecified: Secondary | ICD-10-CM | POA: Diagnosis not present

## 2021-06-02 DIAGNOSIS — E782 Mixed hyperlipidemia: Secondary | ICD-10-CM | POA: Diagnosis not present

## 2021-07-03 DIAGNOSIS — E782 Mixed hyperlipidemia: Secondary | ICD-10-CM | POA: Diagnosis not present

## 2021-07-03 DIAGNOSIS — M19049 Primary osteoarthritis, unspecified hand: Secondary | ICD-10-CM | POA: Diagnosis not present

## 2021-07-03 DIAGNOSIS — M069 Rheumatoid arthritis, unspecified: Secondary | ICD-10-CM | POA: Diagnosis not present

## 2021-07-07 DIAGNOSIS — M150 Primary generalized (osteo)arthritis: Secondary | ICD-10-CM | POA: Diagnosis not present

## 2021-07-07 DIAGNOSIS — M0579 Rheumatoid arthritis with rheumatoid factor of multiple sites without organ or systems involvement: Secondary | ICD-10-CM | POA: Diagnosis not present

## 2021-07-07 DIAGNOSIS — Z6823 Body mass index (BMI) 23.0-23.9, adult: Secondary | ICD-10-CM | POA: Diagnosis not present

## 2021-07-07 DIAGNOSIS — R7989 Other specified abnormal findings of blood chemistry: Secondary | ICD-10-CM | POA: Diagnosis not present

## 2021-07-27 ENCOUNTER — Other Ambulatory Visit: Payer: Self-pay | Admitting: Family Medicine

## 2021-07-27 DIAGNOSIS — Z1231 Encounter for screening mammogram for malignant neoplasm of breast: Secondary | ICD-10-CM

## 2021-08-03 DIAGNOSIS — M069 Rheumatoid arthritis, unspecified: Secondary | ICD-10-CM | POA: Diagnosis not present

## 2021-08-03 DIAGNOSIS — E782 Mixed hyperlipidemia: Secondary | ICD-10-CM | POA: Diagnosis not present

## 2021-08-03 DIAGNOSIS — M19049 Primary osteoarthritis, unspecified hand: Secondary | ICD-10-CM | POA: Diagnosis not present

## 2021-08-16 ENCOUNTER — Ambulatory Visit
Admission: RE | Admit: 2021-08-16 | Discharge: 2021-08-16 | Disposition: A | Discharge status: Home / Self Care | Payer: Medicare HMO | Source: Ambulatory Visit | Attending: Family Medicine | Admitting: Family Medicine

## 2021-08-16 ENCOUNTER — Other Ambulatory Visit: Payer: Self-pay

## 2021-08-16 DIAGNOSIS — Z1231 Encounter for screening mammogram for malignant neoplasm of breast: Secondary | ICD-10-CM

## 2021-09-02 DIAGNOSIS — E782 Mixed hyperlipidemia: Secondary | ICD-10-CM | POA: Diagnosis not present

## 2021-09-02 DIAGNOSIS — M069 Rheumatoid arthritis, unspecified: Secondary | ICD-10-CM | POA: Diagnosis not present

## 2021-10-03 DIAGNOSIS — M069 Rheumatoid arthritis, unspecified: Secondary | ICD-10-CM | POA: Diagnosis not present

## 2021-10-03 DIAGNOSIS — E782 Mixed hyperlipidemia: Secondary | ICD-10-CM | POA: Diagnosis not present

## 2021-11-02 DIAGNOSIS — M069 Rheumatoid arthritis, unspecified: Secondary | ICD-10-CM | POA: Diagnosis not present

## 2021-11-02 DIAGNOSIS — E782 Mixed hyperlipidemia: Secondary | ICD-10-CM | POA: Diagnosis not present

## 2021-11-07 DIAGNOSIS — E782 Mixed hyperlipidemia: Secondary | ICD-10-CM | POA: Diagnosis not present

## 2021-11-13 DIAGNOSIS — Z6824 Body mass index (BMI) 24.0-24.9, adult: Secondary | ICD-10-CM | POA: Diagnosis not present

## 2021-11-13 DIAGNOSIS — D849 Immunodeficiency, unspecified: Secondary | ICD-10-CM | POA: Diagnosis not present

## 2021-11-13 DIAGNOSIS — E782 Mixed hyperlipidemia: Secondary | ICD-10-CM | POA: Diagnosis not present

## 2021-11-13 DIAGNOSIS — M069 Rheumatoid arthritis, unspecified: Secondary | ICD-10-CM | POA: Diagnosis not present

## 2021-11-26 IMAGING — MG MM DIGITAL SCREENING BILAT W/ TOMO AND CAD
8 series · 9 of 24 positions shown · non-contrast
Comparison: Previous exam(s).

CLINICAL DATA: Screening.

EXAM:
DIGITAL SCREENING BILATERAL MAMMOGRAM WITH TOMOSYNTHESIS AND CAD
TECHNIQUE: Bilateral screening digital craniocaudal and mediolateral oblique
mammograms were obtained. Bilateral screening digital breast
tomosynthesis was performed. The images were evaluated with
computer-aided detection.

[L CC synth-2D]
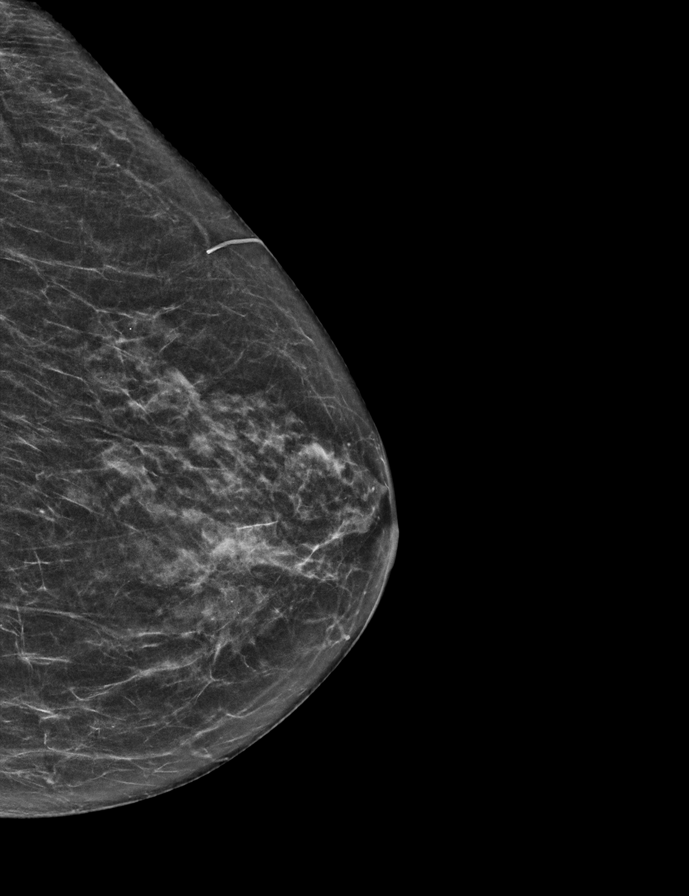

[R MLO synth-2D]
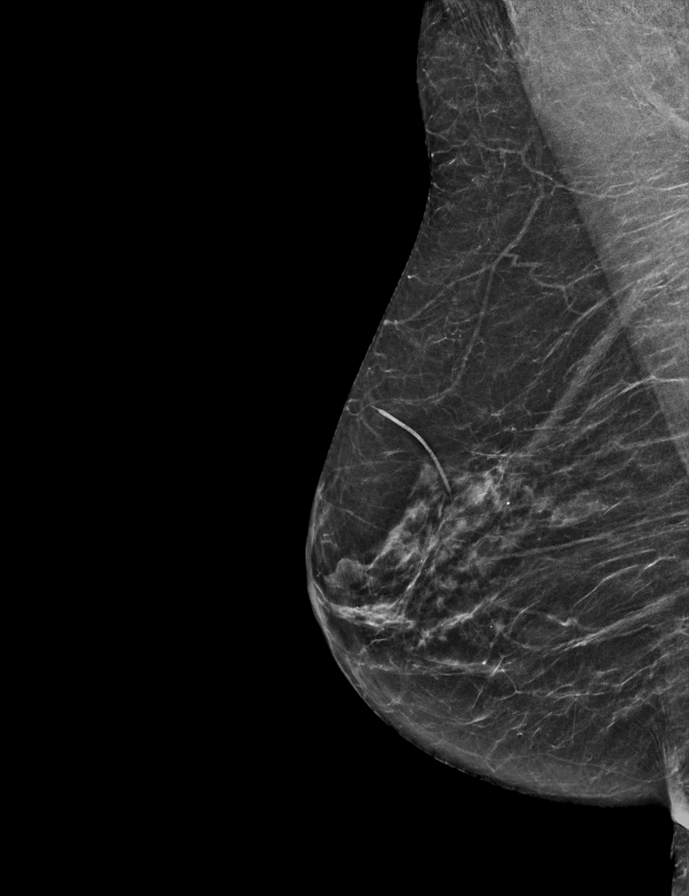

[L MLO synth-2D]
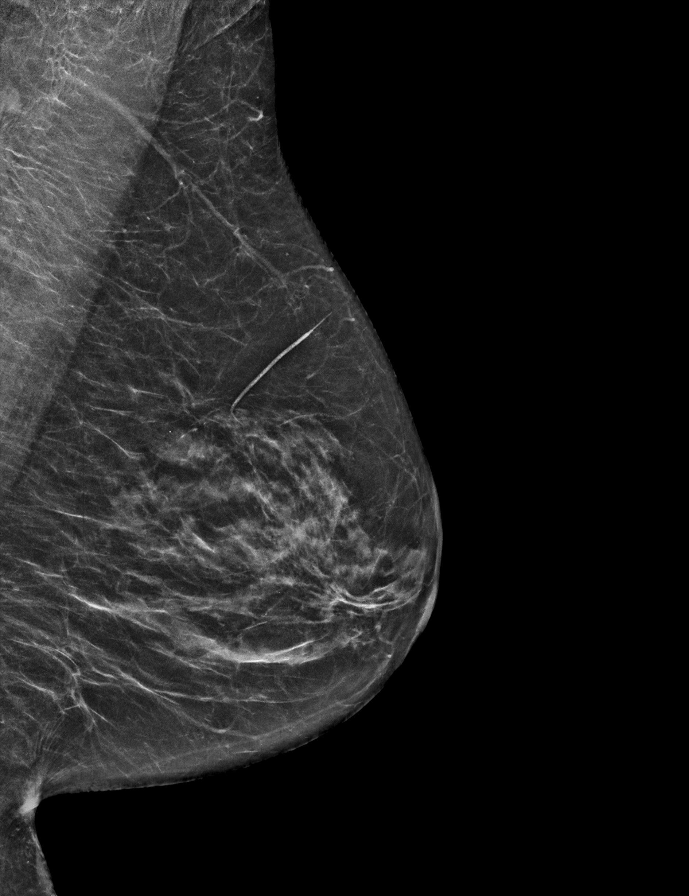

[R CC synth-2D]
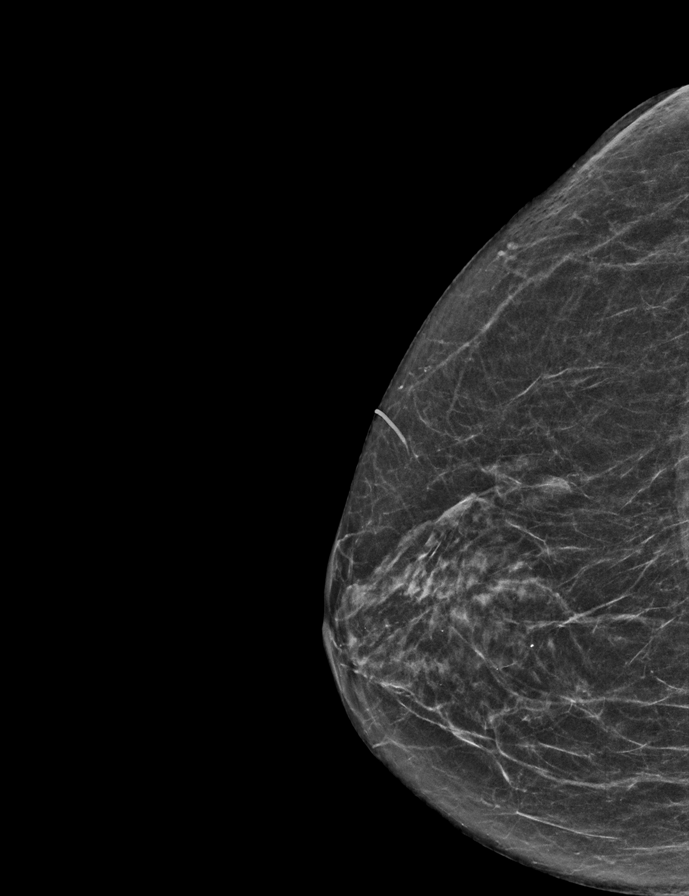

[L MLO tomo · 2 of 53 frames shown]
[frame 18/53]
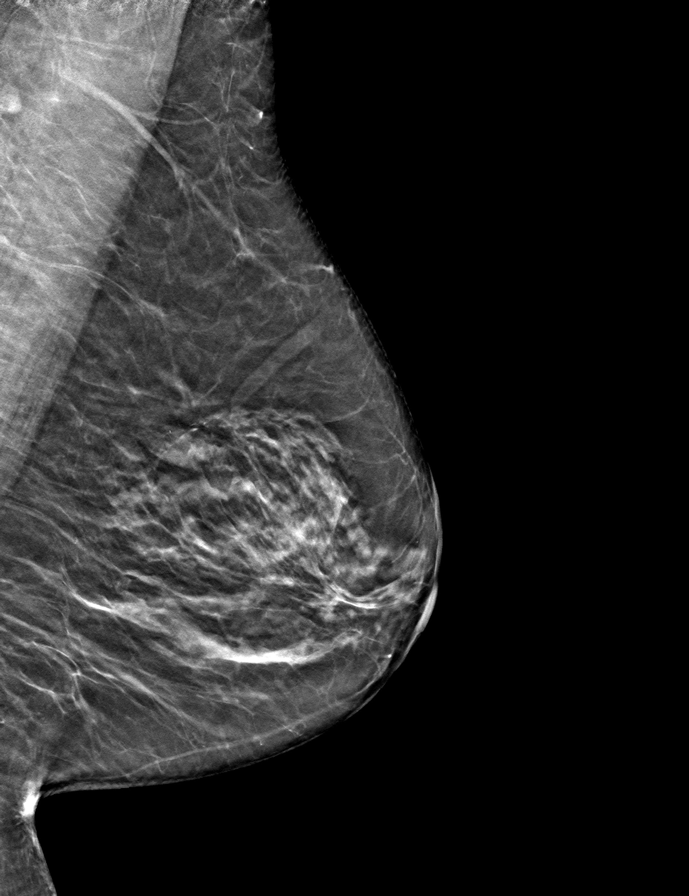
[frame 27/53]
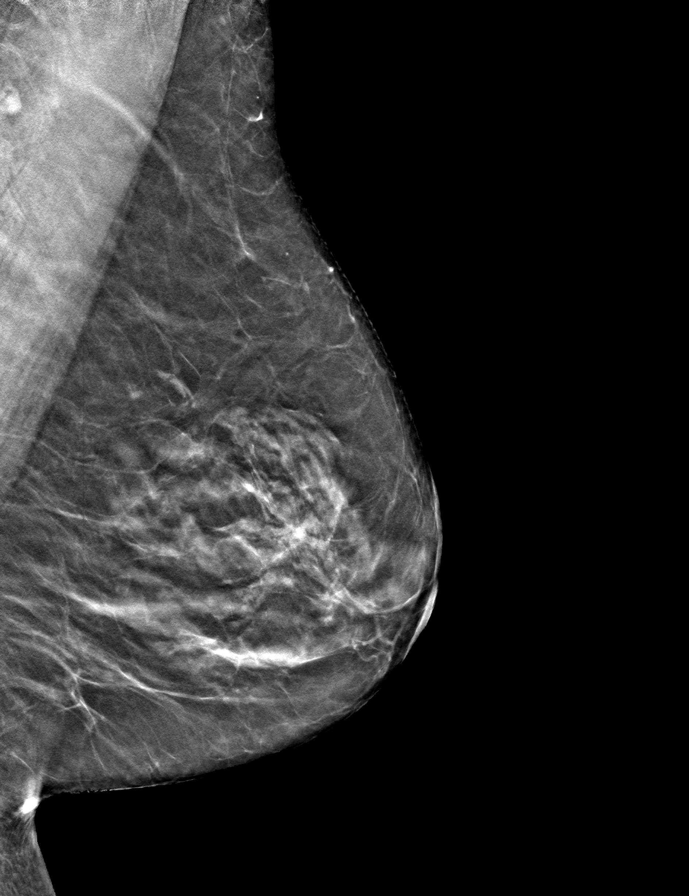

[R CC tomo · tomo slice 25/48.0]
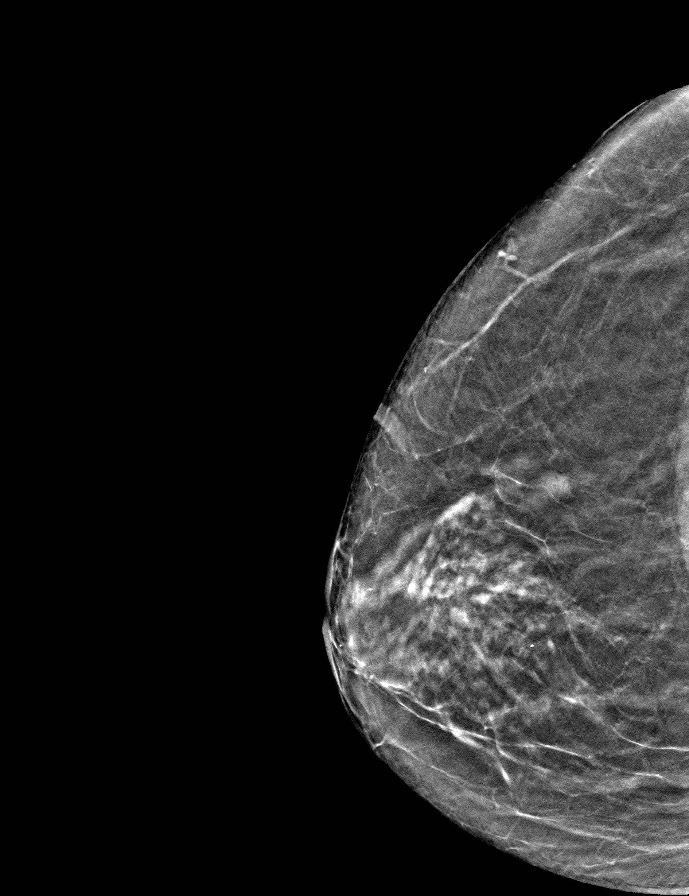

[R MLO tomo · tomo slice 24/47.0]
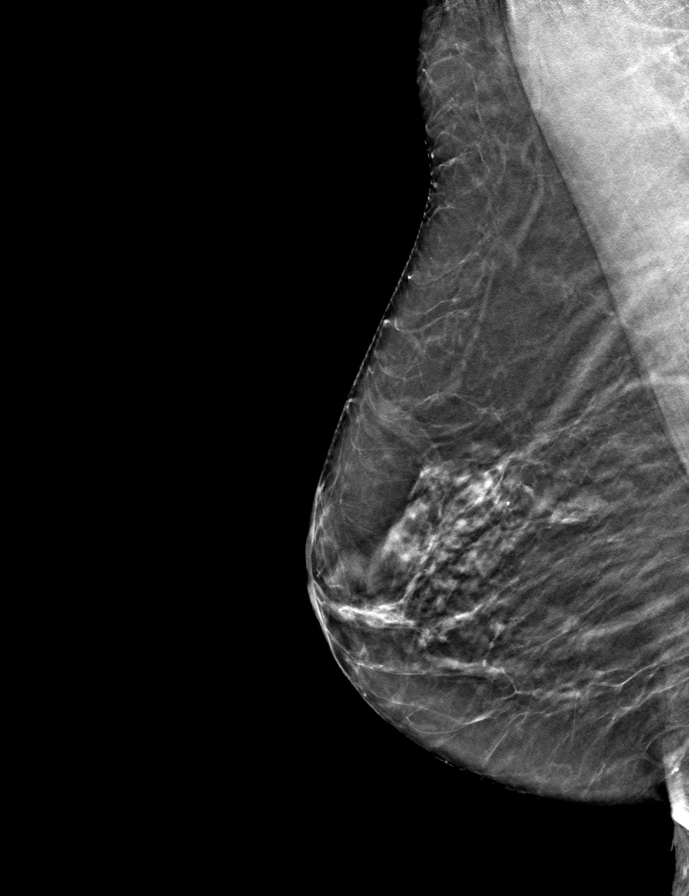

[L CC tomo · tomo slice 25/48.0]
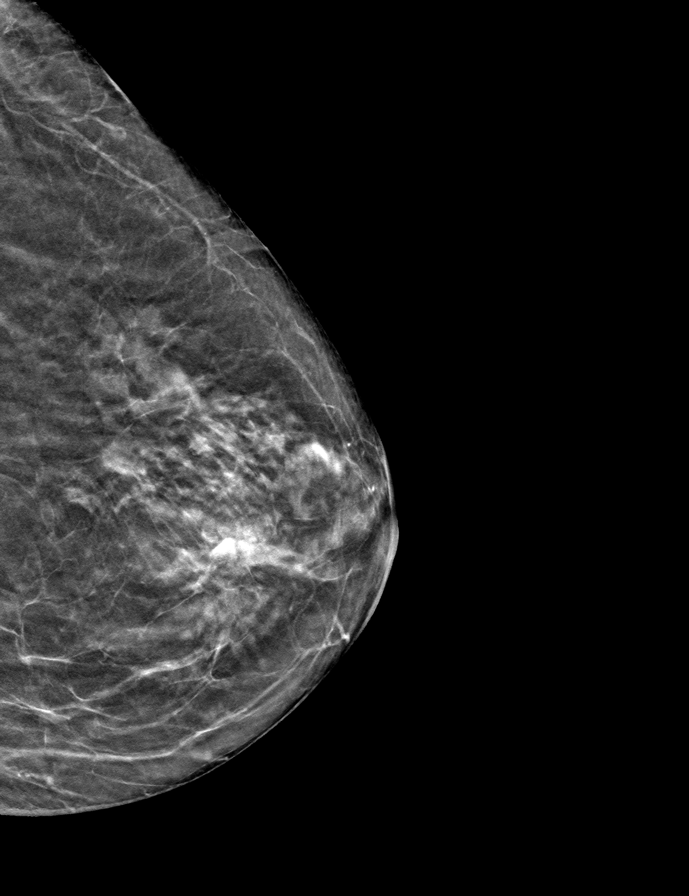

[9 of 24 positions shown; findings below may reference images not displayed]

ACR Breast Density Category c: The breast tissue is heterogeneously
dense, which may obscure small masses.
FINDINGS: There are no findings suspicious for malignancy.
IMPRESSION: No mammographic evidence of malignancy. A result letter of this
screening mammogram will be mailed directly to the patient.

RECOMMENDATION:
Screening mammogram in one year. (Code:Q3-W-BC3)

BI-RADS CATEGORY  1: Negative.

## 2021-12-03 DIAGNOSIS — M069 Rheumatoid arthritis, unspecified: Secondary | ICD-10-CM | POA: Diagnosis not present

## 2021-12-03 DIAGNOSIS — E782 Mixed hyperlipidemia: Secondary | ICD-10-CM | POA: Diagnosis not present

## 2021-12-04 DIAGNOSIS — D84821 Immunodeficiency due to drugs: Secondary | ICD-10-CM | POA: Diagnosis not present

## 2021-12-04 DIAGNOSIS — Z79620 Long term (current) use of immunosuppressive biologic: Secondary | ICD-10-CM | POA: Diagnosis not present

## 2021-12-04 DIAGNOSIS — E785 Hyperlipidemia, unspecified: Secondary | ICD-10-CM | POA: Diagnosis not present

## 2021-12-04 DIAGNOSIS — M069 Rheumatoid arthritis, unspecified: Secondary | ICD-10-CM | POA: Diagnosis not present

## 2021-12-04 DIAGNOSIS — Z809 Family history of malignant neoplasm, unspecified: Secondary | ICD-10-CM | POA: Diagnosis not present

## 2021-12-04 DIAGNOSIS — R030 Elevated blood-pressure reading, without diagnosis of hypertension: Secondary | ICD-10-CM | POA: Diagnosis not present

## 2022-01-03 DIAGNOSIS — M069 Rheumatoid arthritis, unspecified: Secondary | ICD-10-CM | POA: Diagnosis not present

## 2022-01-03 DIAGNOSIS — E782 Mixed hyperlipidemia: Secondary | ICD-10-CM | POA: Diagnosis not present

## 2022-01-08 DIAGNOSIS — R7989 Other specified abnormal findings of blood chemistry: Secondary | ICD-10-CM | POA: Diagnosis not present

## 2022-01-08 DIAGNOSIS — M0579 Rheumatoid arthritis with rheumatoid factor of multiple sites without organ or systems involvement: Secondary | ICD-10-CM | POA: Diagnosis not present

## 2022-01-08 DIAGNOSIS — Z111 Encounter for screening for respiratory tuberculosis: Secondary | ICD-10-CM | POA: Diagnosis not present

## 2022-01-08 DIAGNOSIS — M150 Primary generalized (osteo)arthritis: Secondary | ICD-10-CM | POA: Diagnosis not present

## 2022-01-08 DIAGNOSIS — Z6824 Body mass index (BMI) 24.0-24.9, adult: Secondary | ICD-10-CM | POA: Diagnosis not present

## 2022-01-31 DIAGNOSIS — M069 Rheumatoid arthritis, unspecified: Secondary | ICD-10-CM | POA: Diagnosis not present

## 2022-01-31 DIAGNOSIS — E782 Mixed hyperlipidemia: Secondary | ICD-10-CM | POA: Diagnosis not present

## 2022-03-03 DIAGNOSIS — E782 Mixed hyperlipidemia: Secondary | ICD-10-CM | POA: Diagnosis not present

## 2022-03-03 DIAGNOSIS — M069 Rheumatoid arthritis, unspecified: Secondary | ICD-10-CM | POA: Diagnosis not present

## 2022-04-09 DIAGNOSIS — E782 Mixed hyperlipidemia: Secondary | ICD-10-CM | POA: Diagnosis not present

## 2022-04-16 DIAGNOSIS — E782 Mixed hyperlipidemia: Secondary | ICD-10-CM | POA: Diagnosis not present

## 2022-04-16 DIAGNOSIS — Z23 Encounter for immunization: Secondary | ICD-10-CM | POA: Diagnosis not present

## 2022-04-16 DIAGNOSIS — D849 Immunodeficiency, unspecified: Secondary | ICD-10-CM | POA: Diagnosis not present

## 2022-04-16 DIAGNOSIS — M069 Rheumatoid arthritis, unspecified: Secondary | ICD-10-CM | POA: Diagnosis not present

## 2022-05-03 DIAGNOSIS — E782 Mixed hyperlipidemia: Secondary | ICD-10-CM | POA: Diagnosis not present

## 2022-05-03 DIAGNOSIS — M069 Rheumatoid arthritis, unspecified: Secondary | ICD-10-CM | POA: Diagnosis not present

## 2022-05-30 DIAGNOSIS — Z139 Encounter for screening, unspecified: Secondary | ICD-10-CM | POA: Diagnosis not present

## 2022-05-30 DIAGNOSIS — Z1339 Encounter for screening examination for other mental health and behavioral disorders: Secondary | ICD-10-CM | POA: Diagnosis not present

## 2022-05-30 DIAGNOSIS — Z0189 Encounter for other specified special examinations: Secondary | ICD-10-CM | POA: Diagnosis not present

## 2022-05-30 DIAGNOSIS — Z136 Encounter for screening for cardiovascular disorders: Secondary | ICD-10-CM | POA: Diagnosis not present

## 2022-05-30 DIAGNOSIS — Z0000 Encounter for general adult medical examination without abnormal findings: Secondary | ICD-10-CM | POA: Diagnosis not present

## 2022-05-30 DIAGNOSIS — Z1389 Encounter for screening for other disorder: Secondary | ICD-10-CM | POA: Diagnosis not present

## 2022-05-30 DIAGNOSIS — Z1331 Encounter for screening for depression: Secondary | ICD-10-CM | POA: Diagnosis not present

## 2022-06-02 DIAGNOSIS — M069 Rheumatoid arthritis, unspecified: Secondary | ICD-10-CM | POA: Diagnosis not present

## 2022-06-02 DIAGNOSIS — E782 Mixed hyperlipidemia: Secondary | ICD-10-CM | POA: Diagnosis not present

## 2022-06-22 DIAGNOSIS — H353131 Nonexudative age-related macular degeneration, bilateral, early dry stage: Secondary | ICD-10-CM | POA: Diagnosis not present

## 2022-06-27 ENCOUNTER — Other Ambulatory Visit: Payer: Self-pay | Admitting: Family Medicine

## 2022-06-27 DIAGNOSIS — Z1231 Encounter for screening mammogram for malignant neoplasm of breast: Secondary | ICD-10-CM

## 2022-07-03 DIAGNOSIS — M069 Rheumatoid arthritis, unspecified: Secondary | ICD-10-CM | POA: Diagnosis not present

## 2022-07-03 DIAGNOSIS — E782 Mixed hyperlipidemia: Secondary | ICD-10-CM | POA: Diagnosis not present

## 2022-07-09 DIAGNOSIS — Z6824 Body mass index (BMI) 24.0-24.9, adult: Secondary | ICD-10-CM | POA: Diagnosis not present

## 2022-07-09 DIAGNOSIS — R7989 Other specified abnormal findings of blood chemistry: Secondary | ICD-10-CM | POA: Diagnosis not present

## 2022-07-09 DIAGNOSIS — M1991 Primary osteoarthritis, unspecified site: Secondary | ICD-10-CM | POA: Diagnosis not present

## 2022-07-09 DIAGNOSIS — M0579 Rheumatoid arthritis with rheumatoid factor of multiple sites without organ or systems involvement: Secondary | ICD-10-CM | POA: Diagnosis not present

## 2022-08-03 DIAGNOSIS — M069 Rheumatoid arthritis, unspecified: Secondary | ICD-10-CM | POA: Diagnosis not present

## 2022-08-03 DIAGNOSIS — E782 Mixed hyperlipidemia: Secondary | ICD-10-CM | POA: Diagnosis not present

## 2022-08-20 ENCOUNTER — Inpatient Hospital Stay: Admission: RE | Admit: 2022-08-20 | Payer: Medicare HMO | Source: Ambulatory Visit

## 2022-08-20 DIAGNOSIS — B338 Other specified viral diseases: Secondary | ICD-10-CM | POA: Diagnosis not present

## 2022-08-31 DIAGNOSIS — Z1231 Encounter for screening mammogram for malignant neoplasm of breast: Secondary | ICD-10-CM | POA: Diagnosis not present

## 2022-09-02 DIAGNOSIS — D849 Immunodeficiency, unspecified: Secondary | ICD-10-CM | POA: Diagnosis not present

## 2022-09-02 DIAGNOSIS — E782 Mixed hyperlipidemia: Secondary | ICD-10-CM | POA: Diagnosis not present

## 2022-09-04 DIAGNOSIS — E782 Mixed hyperlipidemia: Secondary | ICD-10-CM | POA: Diagnosis not present

## 2022-09-17 DIAGNOSIS — Z1231 Encounter for screening mammogram for malignant neoplasm of breast: Secondary | ICD-10-CM | POA: Diagnosis not present

## 2022-09-17 DIAGNOSIS — M069 Rheumatoid arthritis, unspecified: Secondary | ICD-10-CM | POA: Diagnosis not present

## 2022-09-17 DIAGNOSIS — E782 Mixed hyperlipidemia: Secondary | ICD-10-CM | POA: Diagnosis not present

## 2022-09-17 DIAGNOSIS — Z23 Encounter for immunization: Secondary | ICD-10-CM | POA: Diagnosis not present

## 2022-09-17 DIAGNOSIS — Z6825 Body mass index (BMI) 25.0-25.9, adult: Secondary | ICD-10-CM | POA: Diagnosis not present

## 2022-10-03 DIAGNOSIS — E782 Mixed hyperlipidemia: Secondary | ICD-10-CM | POA: Diagnosis not present

## 2022-10-03 DIAGNOSIS — D849 Immunodeficiency, unspecified: Secondary | ICD-10-CM | POA: Diagnosis not present

## 2022-11-02 DIAGNOSIS — E782 Mixed hyperlipidemia: Secondary | ICD-10-CM | POA: Diagnosis not present

## 2022-11-02 DIAGNOSIS — D849 Immunodeficiency, unspecified: Secondary | ICD-10-CM | POA: Diagnosis not present

## 2022-12-03 DIAGNOSIS — D849 Immunodeficiency, unspecified: Secondary | ICD-10-CM | POA: Diagnosis not present

## 2022-12-03 DIAGNOSIS — E782 Mixed hyperlipidemia: Secondary | ICD-10-CM | POA: Diagnosis not present

## 2023-01-03 DIAGNOSIS — E782 Mixed hyperlipidemia: Secondary | ICD-10-CM | POA: Diagnosis not present

## 2023-01-03 DIAGNOSIS — D849 Immunodeficiency, unspecified: Secondary | ICD-10-CM | POA: Diagnosis not present

## 2023-01-08 DIAGNOSIS — Z111 Encounter for screening for respiratory tuberculosis: Secondary | ICD-10-CM | POA: Diagnosis not present

## 2023-01-08 DIAGNOSIS — M0579 Rheumatoid arthritis with rheumatoid factor of multiple sites without organ or systems involvement: Secondary | ICD-10-CM | POA: Diagnosis not present

## 2023-01-08 DIAGNOSIS — Z6825 Body mass index (BMI) 25.0-25.9, adult: Secondary | ICD-10-CM | POA: Diagnosis not present

## 2023-01-08 DIAGNOSIS — R7989 Other specified abnormal findings of blood chemistry: Secondary | ICD-10-CM | POA: Diagnosis not present

## 2023-01-08 DIAGNOSIS — E663 Overweight: Secondary | ICD-10-CM | POA: Diagnosis not present

## 2023-01-08 DIAGNOSIS — M1991 Primary osteoarthritis, unspecified site: Secondary | ICD-10-CM | POA: Diagnosis not present

## 2023-02-01 DIAGNOSIS — E782 Mixed hyperlipidemia: Secondary | ICD-10-CM | POA: Diagnosis not present

## 2023-02-01 DIAGNOSIS — D849 Immunodeficiency, unspecified: Secondary | ICD-10-CM | POA: Diagnosis not present

## 2023-03-04 DIAGNOSIS — D849 Immunodeficiency, unspecified: Secondary | ICD-10-CM | POA: Diagnosis not present

## 2023-03-04 DIAGNOSIS — E782 Mixed hyperlipidemia: Secondary | ICD-10-CM | POA: Diagnosis not present

## 2023-03-11 DIAGNOSIS — E782 Mixed hyperlipidemia: Secondary | ICD-10-CM | POA: Diagnosis not present

## 2023-03-18 DIAGNOSIS — E782 Mixed hyperlipidemia: Secondary | ICD-10-CM | POA: Diagnosis not present

## 2023-03-18 DIAGNOSIS — M069 Rheumatoid arthritis, unspecified: Secondary | ICD-10-CM | POA: Diagnosis not present

## 2023-03-18 DIAGNOSIS — E2839 Other primary ovarian failure: Secondary | ICD-10-CM | POA: Diagnosis not present

## 2023-03-18 DIAGNOSIS — Z6823 Body mass index (BMI) 23.0-23.9, adult: Secondary | ICD-10-CM | POA: Diagnosis not present

## 2023-03-18 DIAGNOSIS — D849 Immunodeficiency, unspecified: Secondary | ICD-10-CM | POA: Diagnosis not present

## 2023-03-28 DIAGNOSIS — M810 Age-related osteoporosis without current pathological fracture: Secondary | ICD-10-CM | POA: Diagnosis not present

## 2023-03-28 DIAGNOSIS — E2839 Other primary ovarian failure: Secondary | ICD-10-CM | POA: Diagnosis not present

## 2023-04-03 DIAGNOSIS — D849 Immunodeficiency, unspecified: Secondary | ICD-10-CM | POA: Diagnosis not present

## 2023-04-03 DIAGNOSIS — E782 Mixed hyperlipidemia: Secondary | ICD-10-CM | POA: Diagnosis not present

## 2023-04-16 DIAGNOSIS — M810 Age-related osteoporosis without current pathological fracture: Secondary | ICD-10-CM | POA: Diagnosis not present

## 2023-04-16 DIAGNOSIS — Z6825 Body mass index (BMI) 25.0-25.9, adult: Secondary | ICD-10-CM | POA: Diagnosis not present

## 2023-04-30 DIAGNOSIS — M810 Age-related osteoporosis without current pathological fracture: Secondary | ICD-10-CM | POA: Diagnosis not present

## 2023-05-04 DIAGNOSIS — E782 Mixed hyperlipidemia: Secondary | ICD-10-CM | POA: Diagnosis not present

## 2023-05-04 DIAGNOSIS — M810 Age-related osteoporosis without current pathological fracture: Secondary | ICD-10-CM | POA: Diagnosis not present

## 2023-06-03 DIAGNOSIS — E782 Mixed hyperlipidemia: Secondary | ICD-10-CM | POA: Diagnosis not present

## 2023-06-03 DIAGNOSIS — M810 Age-related osteoporosis without current pathological fracture: Secondary | ICD-10-CM | POA: Diagnosis not present

## 2023-06-10 DIAGNOSIS — Z136 Encounter for screening for cardiovascular disorders: Secondary | ICD-10-CM | POA: Diagnosis not present

## 2023-06-10 DIAGNOSIS — Z1331 Encounter for screening for depression: Secondary | ICD-10-CM | POA: Diagnosis not present

## 2023-06-10 DIAGNOSIS — Z0189 Encounter for other specified special examinations: Secondary | ICD-10-CM | POA: Diagnosis not present

## 2023-06-10 DIAGNOSIS — Z1339 Encounter for screening examination for other mental health and behavioral disorders: Secondary | ICD-10-CM | POA: Diagnosis not present

## 2023-06-10 DIAGNOSIS — Z0000 Encounter for general adult medical examination without abnormal findings: Secondary | ICD-10-CM | POA: Diagnosis not present

## 2023-06-10 DIAGNOSIS — Z139 Encounter for screening, unspecified: Secondary | ICD-10-CM | POA: Diagnosis not present

## 2023-06-26 DIAGNOSIS — H353132 Nonexudative age-related macular degeneration, bilateral, intermediate dry stage: Secondary | ICD-10-CM | POA: Diagnosis not present

## 2023-07-04 DIAGNOSIS — E782 Mixed hyperlipidemia: Secondary | ICD-10-CM | POA: Diagnosis not present

## 2023-07-04 DIAGNOSIS — M810 Age-related osteoporosis without current pathological fracture: Secondary | ICD-10-CM | POA: Diagnosis not present

## 2023-07-23 DIAGNOSIS — E663 Overweight: Secondary | ICD-10-CM | POA: Diagnosis not present

## 2023-07-23 DIAGNOSIS — M0579 Rheumatoid arthritis with rheumatoid factor of multiple sites without organ or systems involvement: Secondary | ICD-10-CM | POA: Diagnosis not present

## 2023-07-23 DIAGNOSIS — R7989 Other specified abnormal findings of blood chemistry: Secondary | ICD-10-CM | POA: Diagnosis not present

## 2023-07-23 DIAGNOSIS — Z6825 Body mass index (BMI) 25.0-25.9, adult: Secondary | ICD-10-CM | POA: Diagnosis not present

## 2023-07-23 DIAGNOSIS — M1991 Primary osteoarthritis, unspecified site: Secondary | ICD-10-CM | POA: Diagnosis not present

## 2023-07-23 DIAGNOSIS — Z111 Encounter for screening for respiratory tuberculosis: Secondary | ICD-10-CM | POA: Diagnosis not present

## 2023-07-23 DIAGNOSIS — M810 Age-related osteoporosis without current pathological fracture: Secondary | ICD-10-CM | POA: Diagnosis not present

## 2023-08-04 DIAGNOSIS — M810 Age-related osteoporosis without current pathological fracture: Secondary | ICD-10-CM | POA: Diagnosis not present

## 2023-08-04 DIAGNOSIS — E782 Mixed hyperlipidemia: Secondary | ICD-10-CM | POA: Diagnosis not present

## 2023-09-03 DIAGNOSIS — M810 Age-related osteoporosis without current pathological fracture: Secondary | ICD-10-CM | POA: Diagnosis not present

## 2023-09-03 DIAGNOSIS — E782 Mixed hyperlipidemia: Secondary | ICD-10-CM | POA: Diagnosis not present

## 2023-09-10 DIAGNOSIS — E782 Mixed hyperlipidemia: Secondary | ICD-10-CM | POA: Diagnosis not present

## 2023-09-11 ENCOUNTER — Other Ambulatory Visit: Payer: Self-pay | Admitting: Family Medicine

## 2023-09-11 DIAGNOSIS — Z1231 Encounter for screening mammogram for malignant neoplasm of breast: Secondary | ICD-10-CM

## 2023-09-17 DIAGNOSIS — N182 Chronic kidney disease, stage 2 (mild): Secondary | ICD-10-CM | POA: Diagnosis not present

## 2023-09-17 DIAGNOSIS — Z23 Encounter for immunization: Secondary | ICD-10-CM | POA: Diagnosis not present

## 2023-09-17 DIAGNOSIS — Z6824 Body mass index (BMI) 24.0-24.9, adult: Secondary | ICD-10-CM | POA: Diagnosis not present

## 2023-09-17 DIAGNOSIS — R0989 Other specified symptoms and signs involving the circulatory and respiratory systems: Secondary | ICD-10-CM | POA: Diagnosis not present

## 2023-09-17 DIAGNOSIS — E782 Mixed hyperlipidemia: Secondary | ICD-10-CM | POA: Diagnosis not present

## 2023-09-18 ENCOUNTER — Ambulatory Visit
Admission: RE | Admit: 2023-09-18 | Discharge: 2023-09-18 | Disposition: A | Discharge status: Home / Self Care | Payer: Medicare HMO | Source: Ambulatory Visit | Attending: Family Medicine | Admitting: Family Medicine

## 2023-09-18 DIAGNOSIS — Z1231 Encounter for screening mammogram for malignant neoplasm of breast: Secondary | ICD-10-CM | POA: Diagnosis not present

## 2023-09-23 ENCOUNTER — Other Ambulatory Visit: Payer: Self-pay | Admitting: Family Medicine

## 2023-09-23 DIAGNOSIS — R928 Other abnormal and inconclusive findings on diagnostic imaging of breast: Secondary | ICD-10-CM

## 2023-10-04 DIAGNOSIS — M810 Age-related osteoporosis without current pathological fracture: Secondary | ICD-10-CM | POA: Diagnosis not present

## 2023-10-04 DIAGNOSIS — E782 Mixed hyperlipidemia: Secondary | ICD-10-CM | POA: Diagnosis not present

## 2023-10-07 ENCOUNTER — Ambulatory Visit
Admission: RE | Admit: 2023-10-07 | Discharge: 2023-10-07 | Disposition: A | Discharge status: Home / Self Care | Payer: Medicare HMO | Source: Ambulatory Visit | Attending: Family Medicine | Admitting: Family Medicine

## 2023-10-07 ENCOUNTER — Other Ambulatory Visit: Payer: Self-pay | Admitting: Family Medicine

## 2023-10-07 DIAGNOSIS — R928 Other abnormal and inconclusive findings on diagnostic imaging of breast: Secondary | ICD-10-CM

## 2023-10-07 DIAGNOSIS — R921 Mammographic calcification found on diagnostic imaging of breast: Secondary | ICD-10-CM | POA: Diagnosis not present

## 2023-10-07 DIAGNOSIS — N6320 Unspecified lump in the left breast, unspecified quadrant: Secondary | ICD-10-CM

## 2023-10-07 DIAGNOSIS — N6321 Unspecified lump in the left breast, upper outer quadrant: Secondary | ICD-10-CM | POA: Diagnosis not present

## 2023-10-28 ENCOUNTER — Ambulatory Visit
Admission: RE | Admit: 2023-10-28 | Discharge: 2023-10-28 | Disposition: A | Discharge status: Home / Self Care | Payer: Medicare HMO | Source: Ambulatory Visit | Attending: Family Medicine | Admitting: Family Medicine

## 2023-10-28 DIAGNOSIS — N6320 Unspecified lump in the left breast, unspecified quadrant: Secondary | ICD-10-CM

## 2023-10-28 DIAGNOSIS — R921 Mammographic calcification found on diagnostic imaging of breast: Secondary | ICD-10-CM

## 2023-10-28 DIAGNOSIS — Z171 Estrogen receptor negative status [ER-]: Secondary | ICD-10-CM | POA: Diagnosis not present

## 2023-10-28 DIAGNOSIS — C50812 Malignant neoplasm of overlapping sites of left female breast: Secondary | ICD-10-CM | POA: Diagnosis not present

## 2023-10-28 DIAGNOSIS — N6321 Unspecified lump in the left breast, upper outer quadrant: Secondary | ICD-10-CM | POA: Diagnosis not present

## 2023-10-28 DIAGNOSIS — N641 Fat necrosis of breast: Secondary | ICD-10-CM | POA: Diagnosis not present

## 2023-10-28 DIAGNOSIS — Z1722 Progesterone receptor negative status: Secondary | ICD-10-CM | POA: Diagnosis not present

## 2023-10-28 DIAGNOSIS — D0512 Intraductal carcinoma in situ of left breast: Secondary | ICD-10-CM | POA: Diagnosis not present

## 2023-10-28 HISTORY — PX: BREAST BIOPSY: SHX20

## 2023-10-29 LAB — SURGICAL PATHOLOGY

## 2023-10-29 NOTE — Progress Notes (Signed)
Face to face contact in Cancer Center with newly diagnosed breast cancer pt. Appt made with DRr Lequita Halt for Tuesday, for 11/05/23. "Breast Cancer Treatment Handbook" and navigation contact info given. Encouraged to call with questions or concerns.

## 2023-11-03 DIAGNOSIS — E782 Mixed hyperlipidemia: Secondary | ICD-10-CM | POA: Diagnosis not present

## 2023-11-03 DIAGNOSIS — M810 Age-related osteoporosis without current pathological fracture: Secondary | ICD-10-CM | POA: Diagnosis not present

## 2023-11-05 DIAGNOSIS — N182 Chronic kidney disease, stage 2 (mild): Secondary | ICD-10-CM | POA: Diagnosis not present

## 2023-11-05 DIAGNOSIS — Z79620 Long term (current) use of immunosuppressive biologic: Secondary | ICD-10-CM | POA: Diagnosis not present

## 2023-11-05 DIAGNOSIS — C50112 Malignant neoplasm of central portion of left female breast: Secondary | ICD-10-CM | POA: Diagnosis not present

## 2023-11-05 DIAGNOSIS — M1990 Unspecified osteoarthritis, unspecified site: Secondary | ICD-10-CM | POA: Diagnosis not present

## 2023-11-05 DIAGNOSIS — Z171 Estrogen receptor negative status [ER-]: Secondary | ICD-10-CM | POA: Diagnosis not present

## 2023-11-05 DIAGNOSIS — H3530 Unspecified macular degeneration: Secondary | ICD-10-CM | POA: Diagnosis not present

## 2023-11-05 DIAGNOSIS — C50919 Malignant neoplasm of unspecified site of unspecified female breast: Secondary | ICD-10-CM | POA: Diagnosis not present

## 2023-11-05 DIAGNOSIS — I739 Peripheral vascular disease, unspecified: Secondary | ICD-10-CM | POA: Diagnosis not present

## 2023-11-05 DIAGNOSIS — M810 Age-related osteoporosis without current pathological fracture: Secondary | ICD-10-CM | POA: Diagnosis not present

## 2023-11-05 DIAGNOSIS — E785 Hyperlipidemia, unspecified: Secondary | ICD-10-CM | POA: Diagnosis not present

## 2023-11-05 DIAGNOSIS — Z008 Encounter for other general examination: Secondary | ICD-10-CM | POA: Diagnosis not present

## 2023-11-05 DIAGNOSIS — Z809 Family history of malignant neoplasm, unspecified: Secondary | ICD-10-CM | POA: Diagnosis not present

## 2023-11-18 NOTE — Progress Notes (Signed)
[facility]  [address] [place],  [place]  [address] [phone]  Clinic Day:  11/19/2023  Referring physician: Abner Greenspan, MD   HISTORY OF PRESENT ILLNESS:  Andrea Cunningham is an 82 y.o. female who I was asked to consult upon for newly diagnosed breast cancer.  Her history dates back to October 2024 when a screening mammogram showed a questionable mass in her left breast.  This was confirmed by a diagnostic mammogram and ultrasound in November 2024, which showed a 1.7 cm upper central left breast mass .  A biopsy of this lesion was done, which came back positive for invasive ductal carcinoma.  Receptor testing showed this tumor to be estrogen, progesterone and Her2/neu receptor negative.  Of note, per IHC, her cancer tested Her2 2+.  Her Ki-67 score was only 5%.  She comes in today to go over her breast cancer biopsy results and their implications.  The patient denies ever noticing any changes in her left breast which alerted her to cancer being present.  To her knowledge, there is no family history or either breast or ovarian cancer.     PAST MEDICAL HISTORY:   Past Medical History:  Diagnosis Date   Anemia    past hx - not current    Arthritis    RA    Blood transfusion without reported diagnosis    Breast cancer (HCC)    Cancer (HCC)    skin cancer leg    Family history of colon cancer in mother    Heart murmur    " leaking vlave" per pt- benign     PAST SURGICAL HISTORY:   Past Surgical History:  Procedure Laterality Date   ABDOMINAL HYSTERECTOMY     benign cyst removed from breast      BREAST BIOPSY Left 10/28/2023   [place] LT BREAST BX W LOC DEV 1ST LESION IMG BX SPEC US GUIDE 10/28/2023 [facility]   BREAST BIOPSY Left 10/28/2023   MM LT BREAST BX W LOC DEV 1ST LESION IMAGE BX SPEC STEREO GUIDE 10/28/2023 [facility]   BREAST EXCISIONAL BIOPSY Bilateral    decades ago "lumps" removed   CATARACT EXTRACTION Bilateral     COLONOSCOPY     oophrectomy     POLYPECTOMY      CURRENT MEDICATIONS:   Current Outpatient Medications  Medication Sig Dispense Refill   acetaminophen (TYLENOL) 650 MG CR tablet Take 650 mg by mouth every 8 (eight) hours as needed for pain.     capecitabine (XELODA) 500 MG tablet Take 3 tablets (1,500 mg total) by mouth 2 (two) times daily after a meal. 1 week break after completing 2 weeks of pills 84 tablet 5   etanercept (ENBREL SURECLICK) 50 MG/ML injection 50mg  Subcutaneous once a week for 28 days     folic acid (FOLVITE) 1 MG tablet Take 1 mg by mouth daily.     ibuprofen (ADVIL) 200 MG tablet 2 tablets with food or milk as needed Orally every 6 hrs     methotrexate (RHEUMATREX) 2.5 MG tablet Take 2.5 mg by mouth once a week. Caution:Chemotherapy. Protect from light.     Omega-3 Fatty Acids (OMEGA-3 FISH OIL PO) Take by mouth daily.     PROLIA 60 MG/ML SOSY injection Inject into the skin.     rosuvastatin (CRESTOR) 5 MG tablet Take 5 mg by mouth once a week.     Vitamin A 2250 MCG (7500 UT) CAPS Take by  mouth.     No current facility-administered medications for this visit.    ALLERGIES:  No Known Allergies  FAMILY HISTORY:   Family History  Problem Relation Age of Onset   Colon cancer Mother    Arthritis Mother    Lung cancer Father    Arthritis Father    Other Sister        MVA   COPD Sister    Other Brother        DIED IN CHILDHOOD   Colon cancer Maternal Aunt    Colon cancer Maternal Uncle    Colon cancer Maternal Grandfather    Colon polyps Other    Stomach cancer Neg Hx    Rectal cancer Neg Hx    Esophageal cancer Neg Hx    Breast cancer Neg Hx     SOCIAL HISTORY:  The patient was born and raised in [place].  She currently lives in town.  She is widowed; she was previously married for 43 years.  She owned and operated a Teacher, music for 47 years.  There is no history of alcoholism or tobacco abuse.  REVIEW OF SYSTEMS:  Review of Systems   Constitutional:  Negative for fatigue and fever.  HENT:   Negative for hearing loss and sore throat.   Eyes:  Negative for eye problems.  Respiratory:  Negative for chest tightness, cough and hemoptysis.   Cardiovascular:  Negative for chest pain and palpitations.  Gastrointestinal:  Negative for abdominal distention, abdominal pain, blood in stool, constipation, diarrhea, nausea and vomiting.  Endocrine: Negative for hot flashes.  Genitourinary:  Negative for difficulty urinating, dysuria, frequency, hematuria and nocturia.   Musculoskeletal:  Positive for arthralgias. Negative for back pain, gait problem and myalgias.  Skin: Negative.  Negative for itching and rash.  Neurological: Negative.  Negative for dizziness, extremity weakness, gait problem, headaches, light-headedness and numbness.  Hematological: Negative.   Psychiatric/Behavioral: Negative.  Negative for depression and suicidal ideas. The patient is not nervous/anxious.      PHYSICAL EXAM:  Blood pressure (!) 126/95, pulse (!) 104, temperature 98.1 F (36.7 C), temperature source Oral, resp. rate 14, height 5\' 2"  (1.575 m), weight 135 lb 12.8 oz (61.6 kg), SpO2 99%. Wt Readings from Last 3 Encounters:  01/02/24 139 lb 9.6 oz (63.3 kg)  11/19/23 135 lb 12.8 oz (61.6 kg)  05/22/19 128 lb (58.1 kg)   Body mass index is 24.84 kg/m. Performance status (ECOG): 0 - Asymptomatic Physical Exam Constitutional:      Appearance: Normal appearance.  HENT:     Mouth/Throat:     Pharynx: Oropharynx is clear. No oropharyngeal exudate.  Cardiovascular:     Rate and Rhythm: Normal rate and regular rhythm.     Heart sounds: No murmur heard.    No friction rub. No gallop.  Pulmonary:     Breath sounds: Normal breath sounds.  Chest:  Breasts:    Right: No swelling, bleeding, inverted nipple, mass, nipple discharge or skin change.     Left: Mass (A subtle mass is palpated in the upper outer quadrant of her left breast) present. No  swelling, bleeding, inverted nipple, nipple discharge or skin change.  Abdominal:     General: Bowel sounds are normal. There is no distension.     Palpations: Abdomen is soft. There is no mass.     Tenderness: There is no abdominal tenderness.  Musculoskeletal:        General: No tenderness.     Cervical  back: Normal range of motion and neck supple.     Right lower leg: No edema.     Left lower leg: No edema.  Lymphadenopathy:     Cervical: No cervical adenopathy.     Right cervical: No superficial, deep or posterior cervical adenopathy.    Left cervical: No superficial, deep or posterior cervical adenopathy.     Upper Body:     Right upper body: No supraclavicular or axillary adenopathy.     Left upper body: No supraclavicular or axillary adenopathy.     Lower Body: No right inguinal adenopathy. No left inguinal adenopathy.  Skin:    Coloration: Skin is not jaundiced.     Findings: No lesion or rash.  Neurological:     General: No focal deficit present.     Mental Status: She is alert and oriented to person, place, and time. Mental status is at baseline.  Psychiatric:        Mood and Affect: Mood normal.        Behavior: Behavior normal.        Thought Content: Thought content normal.        Judgment: Judgment normal.    LABS:      Latest Ref Rng & Units 11/19/2023    3:02 PM  CBC  WBC 4.0 - 10.5 K/uL 6.3   Hemoglobin 12.0 - 15.0 g/dL 09.6   Hematocrit 04.5 - 46.0 % 39.4   Platelets 150 - 400 K/uL 185       Latest Ref Rng & Units 11/19/2023    3:02 PM  CMP  Glucose 70 - 99 mg/dL 98   BUN 8 - 23 mg/dL 12   Creatinine 4.09 - 1.00 mg/dL 8.11   Sodium 914 - 782 mmol/L 138   Potassium 3.5 - 5.1 mmol/L 3.9   Chloride 98 - 111 mmol/L 103   CO2 22 - 32 mmol/L 25   Calcium 8.9 - 10.3 mg/dL 9.6   Total Protein 6.5 - 8.1 g/dL 7.0   Total Bilirubin <9.5 mg/dL 0.4   Alkaline Phos 38 - 126 U/L 43   AST 15 - 41 U/L 25   ALT 0 - 44 U/L 12     ASSESSMENT & PLAN:   JAUNA RACZYNSKI is an 82 y.o. female who I was asked to consult upon for what clinically appears to be a stage IB (T1c N0 M0) triple negative breast cancer.  In clinic today, I went over all of her pathologic results with her.  It usually is not common for triple negative breast cancer to be seen in older women.  Another interesting feature of her breast cancer is the fact that despite it being triple negative, her Ki-67 score was very low at 5%.  Such findings are usually discordant with 1 another.  For completeness, I would like for this patient undergo a breast MRI to ensure there are no other masses within her left breast or that the mass we know of may not actually be larger than what it is seen per her diagnostic mammogram.  Overall, I do believe this patient would be amenable to undergoing a lumpectomy.  As she does have triple negative disease, theoretically, the plan would entail her being on some form of chemotherapy for adjuvant treatment as endocrine therapy would prove of no benefit.  The patient is scheduled for a breast MRI in the forthcoming days.  The results from it will determine what type of breast cancer surgery  would be the most ideal for her.  I will tentatively see this patient back in 6 weeks to go over all of the breast cancer pathology from her upcoming breast surgery to ensure nothing has changed from her initial biopsy results that wiould warrant a different adjuvant treatment plan.  The patient and her family understand all the plans discussed today and are in agreement with them.  I do appreciate Abner Greenspan, MD for his new consult. so   Andrea Haffey Kirby Funk, MD

## 2023-11-19 ENCOUNTER — Encounter: Payer: Self-pay | Admitting: Oncology

## 2023-11-19 ENCOUNTER — Inpatient Hospital Stay: Payer: Medicare HMO | Attending: Oncology | Admitting: Oncology

## 2023-11-19 ENCOUNTER — Inpatient Hospital Stay: Payer: Medicare HMO

## 2023-11-19 ENCOUNTER — Other Ambulatory Visit: Payer: Self-pay | Admitting: Oncology

## 2023-11-19 VITALS — BP 126/95 | HR 104 | Temp 98.1°F | Resp 14 | Ht 62.0 in | Wt 135.8 lb

## 2023-11-19 DIAGNOSIS — C50812 Malignant neoplasm of overlapping sites of left female breast: Secondary | ICD-10-CM | POA: Insufficient documentation

## 2023-11-19 DIAGNOSIS — C50919 Malignant neoplasm of unspecified site of unspecified female breast: Secondary | ICD-10-CM

## 2023-11-19 DIAGNOSIS — Z171 Estrogen receptor negative status [ER-]: Secondary | ICD-10-CM | POA: Insufficient documentation

## 2023-11-19 LAB — CBC WITH DIFFERENTIAL (CANCER CENTER ONLY)
Abs Immature Granulocytes: 0.01 10*3/uL (ref 0.00–0.07)
Basophils Absolute: 0 10*3/uL (ref 0.0–0.1)
Basophils Relative: 1 %
Eosinophils Absolute: 0.1 10*3/uL (ref 0.0–0.5)
Eosinophils Relative: 1 %
HCT: 39.4 % (ref 36.0–46.0)
Hemoglobin: 13.6 g/dL (ref 12.0–15.0)
Immature Granulocytes: 0 %
Lymphocytes Relative: 40 %
Lymphs Abs: 2.5 10*3/uL (ref 0.7–4.0)
MCH: 30.4 pg (ref 26.0–34.0)
MCHC: 34.5 g/dL (ref 30.0–36.0)
MCV: 88.1 fL (ref 80.0–100.0)
Monocytes Absolute: 0.8 10*3/uL (ref 0.1–1.0)
Monocytes Relative: 12 %
Neutro Abs: 2.9 10*3/uL (ref 1.7–7.7)
Neutrophils Relative %: 46 %
Platelet Count: 185 10*3/uL (ref 150–400)
RBC: 4.47 MIL/uL (ref 3.87–5.11)
RDW: 13.2 % (ref 11.5–15.5)
WBC Count: 6.3 10*3/uL (ref 4.0–10.5)
nRBC: 0 % (ref 0.0–0.2)
nRBC: 0 /100{WBCs}

## 2023-11-19 LAB — CMP (CANCER CENTER ONLY)
ALT: 12 U/L (ref 0–44)
AST: 25 U/L (ref 15–41)
Albumin: 4.1 g/dL (ref 3.5–5.0)
Alkaline Phosphatase: 43 U/L (ref 38–126)
Anion gap: 11 (ref 5–15)
BUN: 12 mg/dL (ref 8–23)
CO2: 25 mmol/L (ref 22–32)
Calcium: 9.6 mg/dL (ref 8.9–10.3)
Chloride: 103 mmol/L (ref 98–111)
Creatinine: 0.93 mg/dL (ref 0.44–1.00)
GFR, Estimated: >60 mL/min (ref >60)
Glucose, Bld: 98 mg/dL (ref 70–99)
Potassium: 3.9 mmol/L (ref 3.5–5.1)
Sodium: 138 mmol/L (ref 135–145)
Total Bilirubin: 0.4 mg/dL (ref <1.2)
Total Protein: 7 g/dL (ref 6.5–8.1)

## 2023-11-20 ENCOUNTER — Other Ambulatory Visit: Payer: Self-pay | Admitting: Oncology

## 2023-11-20 NOTE — Progress Notes (Signed)
Face  to face contact with pt and daughters in lobby of Cancer Center. Pt is here for Med Onc consult with Dr. Melvyn Neth. Pt has seen Dr. Lequita Halt who referred her over to Med Onc. Encouraged pt to contact navigator with questions or concerns.Pt is aware that she is here today to discuss possible chemo. 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

## 2023-11-22 DIAGNOSIS — Z171 Estrogen receptor negative status [ER-]: Secondary | ICD-10-CM | POA: Diagnosis not present

## 2023-11-22 DIAGNOSIS — C50112 Malignant neoplasm of central portion of left female breast: Secondary | ICD-10-CM | POA: Diagnosis not present

## 2023-11-25 ENCOUNTER — Encounter: Payer: Self-pay | Admitting: Oncology

## 2023-11-28 ENCOUNTER — Encounter: Payer: Self-pay | Admitting: Oncology

## 2023-12-02 ENCOUNTER — Inpatient Hospital Stay: Payer: Medicare HMO

## 2023-12-02 ENCOUNTER — Telehealth: Payer: Self-pay

## 2023-12-02 ENCOUNTER — Ambulatory Visit
Admission: RE | Admit: 2023-12-02 | Discharge: 2023-12-02 | Disposition: A | Discharge status: Home / Self Care | Payer: Medicare HMO | Source: Ambulatory Visit | Attending: Oncology | Admitting: Oncology

## 2023-12-02 DIAGNOSIS — C50919 Malignant neoplasm of unspecified site of unspecified female breast: Secondary | ICD-10-CM

## 2023-12-02 DIAGNOSIS — C50212 Malignant neoplasm of upper-inner quadrant of left female breast: Secondary | ICD-10-CM | POA: Diagnosis not present

## 2023-12-02 MED ORDER — GADOPICLENOL 0.5 MMOL/ML IV SOLN
6.0000 mL | Freq: Once | INTRAVENOUS | Status: AC | PRN
  Administered 2023-12-02: 6 mL via INTRAVENOUS

## 2023-12-02 NOTE — Progress Notes (Signed)
[facility] Clinical Social Work  Initial Assessment   Andrea Cunningham is a 82 y.o. year old female contacted by phone. Clinical Social Work was referred by nurse for assessment of psychosocial needs.   SDOH (Social Determinants of Health) assessments performed: No   SDOH Screenings   Food Insecurity: No Food Insecurity (11/19/2023)  Housing: Low Risk  (11/19/2023)  Transportation Needs: No Transportation Needs (11/19/2023)  Utilities: Not At Risk (11/19/2023)  Depression (PHQ2-9): Low Risk  (11/19/2023)  Social Connections: Unknown (04/17/2022)   Received from [facility], [facility]  Tobacco Use: Low Risk  (09/18/2023)     Distress Screen completed: No     No data to display            Family/Social Information:  Housing Arrangement: patient lives alone, but has family members who are usually at her home visiting.  Family members/support persons in your life? Family and Friends Transportation concerns: no  Employment: Retired .  Income source: Actor concerns: No Type of concern: None Food access concerns: no Religious or spiritual practice: Not known Services Currently in place:  family Members, Transportation, Income, Insurance   Coping/ Adjustment to diagnosis: Patient understands treatment plan and what happens next? yes Concerns about diagnosis and/or treatment: I'm not especially worried about anything Patient reported stressors: None at this time Patient enjoys time with family/ friends Current coping skills/ strengths: Ability for insight , Active sense of humor , Average or above average intelligence , Capable of independent living , Communication skills , Financial means , General fund of knowledge , Motivation for treatment/growth , and Supportive family/friends     SUMMARY: Current SDOH Barriers:  No SDOH Barriers identified at this time.   Clinical Social Work Clinical Goal(s):  No clinical social work goals at this  time  Interventions: Discussed common feeling and emotions when being diagnosed with cancer, and the importance of support during treatment Informed patient of the support team roles and support services at [facility] Provided CSW contact information and encouraged patient to call with any questions or concerns Provided patient with information about Breast Cancer Support Group.   Follow Up Plan: Patient will contact CSW with any support or resource needs Patient verbalizes understanding of plan: Yes    Marguerita Merles, LCSW Clinical Social Worker [facility]

## 2023-12-02 NOTE — Telephone Encounter (Signed)
 [facility] Clinical Social Work  Clinical Social Work was referred by nurse for assessment of psychosocial needs.  Clinical Social Worker attempted to contact patient by phone to offer support and assess for needs. CSW was unable to reach patient, left vm with direct contact.    Marguerita Merles, LCSW  Clinical Social Worker [facility]

## 2023-12-04 DIAGNOSIS — E782 Mixed hyperlipidemia: Secondary | ICD-10-CM | POA: Diagnosis not present

## 2023-12-04 DIAGNOSIS — M810 Age-related osteoporosis without current pathological fracture: Secondary | ICD-10-CM | POA: Diagnosis not present

## 2023-12-12 ENCOUNTER — Other Ambulatory Visit: Payer: Self-pay

## 2023-12-12 NOTE — Progress Notes (Signed)
 This information ifs for tumor board discussion only and not a part of the official treatment plan.

## 2023-12-16 DIAGNOSIS — Z171 Estrogen receptor negative status [ER-]: Secondary | ICD-10-CM | POA: Diagnosis not present

## 2023-12-16 DIAGNOSIS — N642 Atrophy of breast: Secondary | ICD-10-CM | POA: Diagnosis not present

## 2023-12-16 DIAGNOSIS — N6012 Diffuse cystic mastopathy of left breast: Secondary | ICD-10-CM | POA: Diagnosis not present

## 2023-12-16 DIAGNOSIS — Z9889 Other specified postprocedural states: Secondary | ICD-10-CM | POA: Diagnosis not present

## 2023-12-16 DIAGNOSIS — M1990 Unspecified osteoarthritis, unspecified site: Secondary | ICD-10-CM | POA: Diagnosis not present

## 2023-12-16 DIAGNOSIS — C50912 Malignant neoplasm of unspecified site of left female breast: Secondary | ICD-10-CM | POA: Diagnosis not present

## 2023-12-16 DIAGNOSIS — R918 Other nonspecific abnormal finding of lung field: Secondary | ICD-10-CM | POA: Diagnosis not present

## 2023-12-16 DIAGNOSIS — C50112 Malignant neoplasm of central portion of left female breast: Secondary | ICD-10-CM | POA: Diagnosis not present

## 2023-12-17 DIAGNOSIS — I493 Ventricular premature depolarization: Secondary | ICD-10-CM | POA: Diagnosis not present

## 2023-12-17 DIAGNOSIS — C50912 Malignant neoplasm of unspecified site of left female breast: Secondary | ICD-10-CM | POA: Diagnosis not present

## 2023-12-18 NOTE — Progress Notes (Signed)
 Phone contact with pt. Pt had lumpectomy on Monday, 12/16/23. Pt reports that she is sore but other than that she is doing well.

## 2023-12-25 ENCOUNTER — Telehealth: Payer: Self-pay

## 2023-12-25 NOTE — Telephone Encounter (Signed)
 Message created in error, please disregard.

## 2024-01-02 ENCOUNTER — Inpatient Hospital Stay: Payer: Medicare HMO | Attending: Oncology | Admitting: Oncology

## 2024-01-02 DIAGNOSIS — C50912 Malignant neoplasm of unspecified site of left female breast: Secondary | ICD-10-CM | POA: Diagnosis not present

## 2024-01-04 DIAGNOSIS — E782 Mixed hyperlipidemia: Secondary | ICD-10-CM | POA: Diagnosis not present

## 2024-01-04 DIAGNOSIS — M810 Age-related osteoporosis without current pathological fracture: Secondary | ICD-10-CM | POA: Diagnosis not present

## 2024-01-09 ENCOUNTER — Other Ambulatory Visit: Payer: Self-pay

## 2024-01-09 NOTE — Progress Notes (Signed)
 This information is solely for the purpose of discussion at tumor board and not a part of the official treatment plan.

## 2024-01-09 NOTE — Progress Notes (Signed)
 Erroneous entry

## 2024-01-10 ENCOUNTER — Telehealth: Payer: Self-pay

## 2024-01-10 NOTE — Telephone Encounter (Signed)
 Pt called to find out if she will be able to take pills or chemo? He told me he would get back with me this week, but I haven't heard from him yet. I told her he is out of office today, but that I would send message.

## 2024-01-13 ENCOUNTER — Other Ambulatory Visit: Payer: Self-pay | Admitting: Oncology

## 2024-01-13 MED ORDER — CAPECITABINE 500 MG PO TABS: 1500.0000 mg | ORAL_TABLET | Freq: Two times a day (BID) | ORAL | 5 refills | Status: DC

## 2024-01-14 ENCOUNTER — Other Ambulatory Visit (HOSPITAL_COMMUNITY): Payer: Self-pay

## 2024-01-14 ENCOUNTER — Telehealth: Payer: Self-pay

## 2024-01-14 ENCOUNTER — Other Ambulatory Visit: Payer: Self-pay

## 2024-01-14 DIAGNOSIS — C50919 Malignant neoplasm of unspecified site of unspecified female breast: Secondary | ICD-10-CM

## 2024-01-14 NOTE — Telephone Encounter (Signed)
Dr Melvyn Neth confirms he had a lengthy telephone conversation with pt yesterday afternoon regarding treatment plan.

## 2024-01-14 NOTE — Telephone Encounter (Signed)
Oral Oncology Pharmacist Encounter  Received new prescription for Xeloda (capecitabine) for the adjuvant treatment of stage 1B, triple negative breast cancer, planned duration for a total of 6 cycles or until disease progression or unacceptable toxicity.  Patient will get a new set of labs on 01/15/2024 at clinic visit prior to starting medication. Patients creatinine clearance is 48.2 mL/min based on a creatinine of 0.93. For CrCl less than 50 mL/min, it is recommended to decrease to 75% of xeloda dose. Will discuss with MD after new labs result.   Current medication list in Epic reviewed, DDIs with Xeloda identified: - methotrexate (cat C): will discuss with patient to see if they are receiving medication as methotrexate may increase the nephrotoxic effects of xeloda although in med rec patient receiving once weekly.  - folic acid (cat C): if patient is still taking medication will have them monitor for increased adverse effects of xeloda.   Evaluated chart and no patient barriers to medication adherence noted.  Patient agreement for treatment per phone conversation with patient and MD on 01/13/2024.  Prescription has been e-scribed to the [facility] for benefits analysis and approval. Oral Oncology Clinic will continue to follow for insurance authorization, copayment issues, initial counseling and start date.  Bethel Born, PharmD Hematology/Oncology Clinical Pharmacist [facility] Oral Chemotherapy Navigation Clinic [phone] 01/14/2024 8:13 AM

## 2024-01-15 ENCOUNTER — Inpatient Hospital Stay: Payer: Medicare HMO

## 2024-01-15 ENCOUNTER — Other Ambulatory Visit (HOSPITAL_COMMUNITY): Payer: Self-pay

## 2024-01-15 ENCOUNTER — Telehealth: Payer: Self-pay | Admitting: Pharmacy Technician

## 2024-01-15 ENCOUNTER — Other Ambulatory Visit: Payer: Self-pay

## 2024-01-15 ENCOUNTER — Inpatient Hospital Stay: Payer: Medicare HMO | Attending: Oncology

## 2024-01-15 ENCOUNTER — Other Ambulatory Visit: Payer: Self-pay | Admitting: Pharmacy Technician

## 2024-01-15 DIAGNOSIS — C50812 Malignant neoplasm of overlapping sites of left female breast: Secondary | ICD-10-CM | POA: Diagnosis not present

## 2024-01-15 DIAGNOSIS — C50919 Malignant neoplasm of unspecified site of unspecified female breast: Secondary | ICD-10-CM

## 2024-01-15 LAB — CBC WITH DIFFERENTIAL (CANCER CENTER ONLY)
Abs Immature Granulocytes: 0.01 10*3/uL (ref 0.00–0.07)
Basophils Absolute: 0.1 10*3/uL (ref 0.0–0.1)
Basophils Relative: 1 %
Eosinophils Absolute: 0.2 10*3/uL (ref 0.0–0.5)
Eosinophils Relative: 4 %
HCT: 38.9 % (ref 36.0–46.0)
Hemoglobin: 13.4 g/dL (ref 12.0–15.0)
Immature Granulocytes: 0 %
Lymphocytes Relative: 42 %
Lymphs Abs: 2.5 10*3/uL (ref 0.7–4.0)
MCH: 30.6 pg (ref 26.0–34.0)
MCHC: 34.4 g/dL (ref 30.0–36.0)
MCV: 88.8 fL (ref 80.0–100.0)
Monocytes Absolute: 0.8 10*3/uL (ref 0.1–1.0)
Monocytes Relative: 13 %
Neutro Abs: 2.3 10*3/uL (ref 1.7–7.7)
Neutrophils Relative %: 40 %
Platelet Count: 172 10*3/uL (ref 150–400)
RBC: 4.38 MIL/uL (ref 3.87–5.11)
RDW: 13.3 % (ref 11.5–15.5)
WBC Count: 5.8 10*3/uL (ref 4.0–10.5)
nRBC: 0 % (ref 0.0–0.2)
nRBC: 0 /100{WBCs}

## 2024-01-15 LAB — CMP (CANCER CENTER ONLY)
ALT: 13 U/L (ref 0–44)
AST: 22 U/L (ref 15–41)
Albumin: 4.4 g/dL (ref 3.5–5.0)
Alkaline Phosphatase: 47 U/L (ref 38–126)
Anion gap: 12 (ref 5–15)
BUN: 12 mg/dL (ref 8–23)
CO2: 25 mmol/L (ref 22–32)
Calcium: 9.3 mg/dL (ref 8.9–10.3)
Chloride: 100 mmol/L (ref 98–111)
Creatinine: 0.91 mg/dL (ref 0.44–1.00)
GFR, Estimated: >60 mL/min (ref >60)
Glucose, Bld: 93 mg/dL (ref 70–99)
Potassium: 3.9 mmol/L (ref 3.5–5.1)
Sodium: 137 mmol/L (ref 135–145)
Total Bilirubin: 0.9 mg/dL (ref 0.0–1.2)
Total Protein: 7.1 g/dL (ref 6.5–8.1)

## 2024-01-15 MED ORDER — CAPECITABINE 500 MG PO TABS
ORAL_TABLET | ORAL | 5 refills | Status: DC
  Filled 2024-01-15: qty 70, 21d supply, fill #0
  Filled 2024-01-31: qty 70, 21d supply, fill #1
  Filled 2024-02-19 (×2): qty 70, 21d supply, fill #2
  Filled 2024-03-17: qty 70, 21d supply, fill #3

## 2024-01-15 NOTE — Telephone Encounter (Signed)
Oral Oncology Patient Advocate Encounter  After completing a benefits investigation, prior authorization for capecitabine is not required at this time through [facility].  Patient's copay is $328.32.    Patient is eligible to use cash pricing at [facility]. Cash pricing for #84 tabs is $60  Andrea Cunningham, CPhT-Adv Oncology Pharmacy Patient Advocate [facility] Direct Number: [phone]  Fax: [phone]

## 2024-01-15 NOTE — Progress Notes (Signed)
Specialty Pharmacy Initial Fill Coordination Note  Andrea Cunningham is a 83 y.o. female contacted today regarding refills of specialty medication(s) Capecitabine (XELODA) .  Patient requested Delivery  on 01/17/24  to verified address [address]  [place] [address]-   Medication will be filled on 01/15/24.   Patient is aware of $46.90 copayment. Patient would like to use an AR account for billing.

## 2024-01-15 NOTE — Progress Notes (Signed)
Patient is going to start medication on next Wednesday, 01/22/24. Patient counseled in clinic note on 01/15/2024.  Bethel Born, PharmD Hematology/Oncology Clinical Pharmacist [facility] Oral Chemotherapy Navigation Clinic [phone]

## 2024-01-15 NOTE — Addendum Note (Signed)
Addended by: Belinda Block on: 01/15/2024 12:22 PM   Modules accepted: Orders

## 2024-01-15 NOTE — Progress Notes (Signed)
Oral Chemotherapy Pharmacist Encounter  I met with patient in clinic using two patient identifiers for overview of: Xeloda (capecitabine) for the treatment of triple negative breast cancer, planned duration of 6 cycles or until disease progression or unacceptable drug toxicity.  Counseled patient on administration, dosing, side effects, monitoring, drug-food interactions, safe handling, storage, and disposal.  Patient will take Xeloda 500mg  tablets, 2 tablets (1000mg ) by mouth in AM and 3 tabs (1500mg ) by mouth in PM, within 30 minutes of finishing meals, for 14 days on, 7 days off, repeated every 21 days.  - Due to patients renal function, prescription was reduced to ~75% of the dosing per okay from MD. Patient aware that the dosing has changed since speaking with Dr. Melvyn Neth.   Adverse effects include but are not limited to:  Fatigue: we discussed that fatigue may happen and if it does to please let the office know. Notified patient it is normal for her to feel some fatigue during the second week of the cycle but to notify us if she is having significant fatigue disrupting her normal day to day activities.  Decreased blood counts: patient notified to call us if she has any fevers or is feeling sick as having decreased blood counts can decrease how well her body can fight off any infections. GI upset: patient does not have any nausea medications at home and states that she does get nauseous with some medication. Dr. Melvyn Neth will send in some nausea medications and she was instructed on how to use zofran.  Diarrhea: Patient will pick up some imodium in case she gets diarrhea with this medication. Patient instructed to take 2 tablets of imodium with her first diarrhea in 24 hours and to take 1 tablet with any subsequent diarrhea within those 24 hours. Patient states she understands how to take imodium and knows to call the office if she is having 4 or more loose stools within 24 hours.  Mouth sores: Since  patient has had significant mouth sores after previous medications resulting in significant problems, I spoke with Dr. Melvyn Neth and he will send in a magic mouthwash for her to use as a prophylactic measure to decrease the chances of getting mouth sores with xeloda. Hand-foot syndrome: Patient will pick up diclofenac gel (voltaren gel) if she doesn't already have it at home and knows to put it on her hands and feet twice daily and to use moisturizers throughout the day to help reduce the chances of having HFS.  Reviewed with patient importance of keeping a medication schedule and plan for any missed doses. No barriers to medication adherence identified.  Medication reconciliation performed and medication/allergy list updated. I removed methotrexate and folic acid as patient states that she has not taken methotrexate in a very long time and it made her have very serious mouth sores and does not take folic acid. Patient stated she would like these removed.   Xeloda start date: 01/22/2024   Latest Reference Range & Units 01/15/24 09:58  Sodium 135 - 145 mmol/L 137  Potassium 3.5 - 5.1 mmol/L 3.9  Chloride 98 - 111 mmol/L 100  CO2 22 - 32 mmol/L 25  Glucose 70 - 99 mg/dL 93  BUN 8 - 23 mg/dL 12  Creatinine 2.44 - 0.10 mg/dL 2.72  Calcium 8.9 - 53.6 mg/dL 9.3  Anion gap 5 - 15  12  Alkaline Phosphatase 38 - 126 U/L 47  Albumin 3.5 - 5.0 g/dL 4.4  AST 15 - 41 U/L 22  ALT 0 - 44 U/L 13  Total Protein 6.5 - 8.1 g/dL 7.1  Total Bilirubin 0.0 - 1.2 mg/dL 0.9  GFR, Est Non African American >60 mL/min >60  WBC 4.0 - 10.5 K/uL 5.8  RBC 3.87 - 5.11 MIL/uL 4.38  Hemoglobin 12.0 - 15.0 g/dL 47.8  HCT 29.5 - 62.1 % 38.9  MCV 80.0 - 100.0 fL 88.8  MCH 26.0 - 34.0 pg 30.6  MCHC 30.0 - 36.0 g/dL 30.8  RDW 65.7 - 84.6 % 13.3  Platelets 150 - 400 K/uL 172   All questions answered. Patient voiced understanding and appreciation.  Medication education handout given to patient. Patient knows to call the office  with questions or concerns. Oral Chemotherapy Clinic phone number provided to patient.   Follow up: patient will need follow up visit with Dr. Melvyn Neth prior to cycle 2. Will send message to scheduling for patient to have follow up visit with labs.   Bethel Born, PharmD Hematology/Oncology Clinical Pharmacist [facility]otherapy [facility] [phone]

## 2024-01-16 ENCOUNTER — Telehealth: Payer: Self-pay | Admitting: Oncology

## 2024-01-16 ENCOUNTER — Other Ambulatory Visit: Payer: Self-pay

## 2024-01-16 NOTE — Telephone Encounter (Signed)
Contacted pt to schedule an appt. Unable to reach via phone, voicemail was left.    Per Belinda Block, RPH:  Patient will need follow up visit with Dr. Melvyn Neth with labs (CBC w/diff and CMP) on 3/6 or 3/7 or 3/10 please :) She starts xeloda on 2/19

## 2024-01-16 NOTE — Telephone Encounter (Signed)
Patient has been scheduled. Aware of appt date and time.   Pt also mentioned that she received her medication today in the mail.

## 2024-01-21 DIAGNOSIS — E663 Overweight: Secondary | ICD-10-CM | POA: Diagnosis not present

## 2024-01-21 DIAGNOSIS — M0579 Rheumatoid arthritis with rheumatoid factor of multiple sites without organ or systems involvement: Secondary | ICD-10-CM | POA: Diagnosis not present

## 2024-01-21 DIAGNOSIS — M810 Age-related osteoporosis without current pathological fracture: Secondary | ICD-10-CM | POA: Diagnosis not present

## 2024-01-21 DIAGNOSIS — Z6825 Body mass index (BMI) 25.0-25.9, adult: Secondary | ICD-10-CM | POA: Diagnosis not present

## 2024-01-21 DIAGNOSIS — R7989 Other specified abnormal findings of blood chemistry: Secondary | ICD-10-CM | POA: Diagnosis not present

## 2024-01-21 DIAGNOSIS — M1991 Primary osteoarthritis, unspecified site: Secondary | ICD-10-CM | POA: Diagnosis not present

## 2024-01-28 ENCOUNTER — Telehealth: Payer: Self-pay

## 2024-01-28 NOTE — Telephone Encounter (Signed)
 I spoke with pt. She states she is doing good so far. She can't tell she has had any side effects.She is taking the Xeloda @ 9am, and again @ 6pm. No missed doses. She denies mouth sores, N/V, constipation, diarrhea, heartburn, fever, cough, chills, chest pain, and s/s hand-foot syndrome. I reminded pt to call us if she were to develop temp of 100.4 or higher, day or night. She verbalized understanding. We confirmed her next appt, 02/07/2024.

## 2024-01-31 ENCOUNTER — Other Ambulatory Visit: Payer: Self-pay

## 2024-01-31 ENCOUNTER — Other Ambulatory Visit (HOSPITAL_COMMUNITY): Payer: Self-pay

## 2024-01-31 NOTE — Progress Notes (Signed)
 Specialty Pharmacy Refill Coordination Note  Andrea Cunningham is a 83 y.o. female contacted today regarding refills of specialty medication(s) Capecitabine (XELODA)   Patient requested Delivery   Delivery date: 02/04/24   Verified address: [address]  [place] [address]-   Medication will be filled on 02/03/24  Next Cycle starts 02/12/24.

## 2024-01-31 NOTE — Progress Notes (Signed)
 Specialty Pharmacy Ongoing Clinical Assessment Note  Andrea Cunningham is a 83 y.o. female who is being followed by the specialty pharmacy service for RxSp Oncology   Patient's specialty medication(s) reviewed today: Capecitabine (XELODA)   Missed doses in the last 4 weeks: 0   Patient/Caregiver did not have any additional questions or concerns.   Therapeutic benefit summary: Unable to assess   Adverse events/side effects summary: No adverse events/side effects   Patient's therapy is appropriate to: Continue    Goals Addressed             This Visit's Progress    Stabilization of disease       Patient is unable to be assessed as therapy was recently initiated. Patient will be evaluated at upcoming provider appointment to assess progress         Follow up:  3 months  Bobette Mo Specialty Pharmacist

## 2024-02-01 DIAGNOSIS — E782 Mixed hyperlipidemia: Secondary | ICD-10-CM | POA: Diagnosis not present

## 2024-02-01 DIAGNOSIS — M810 Age-related osteoporosis without current pathological fracture: Secondary | ICD-10-CM | POA: Diagnosis not present

## 2024-02-03 ENCOUNTER — Other Ambulatory Visit: Payer: Self-pay

## 2024-02-06 NOTE — Progress Notes (Signed)
 [facility]  [address] [place],  [place]  [address] [phone]  Clinic Day:  02/07/2024  Referring physician: Abner Greenspan, MD   HISTORY OF PRESENT ILLNESS:  Andrea Cunningham is an 83 y.o. female with stage IB (T1c N0 M0) triple negative breast cancer, status post a left breast lumpectomy in January 2025.  Despite receptor testing showing this tumor to be estrogen, progesterone and Her2/neu receptor negative, her Ki-67 score was only 5%.  After extensively reviewing her case and evaluating treatment options, the plan was ultimately decided to place her on oral Xeloda chemotherapy for her adjuvant therapy.  She current takes this at 1000 mg in the morning and 1500 mg at night, every 2/3 weeks.  The patient claims of tolerated her first cycle of Xeloda extremely well.  She denied having any of the mouth sores, diarrhea, or other symptoms that go along with this agent.  Of note, she is scheduled to start her second cycle of oral Xeloda on Wednesday, March 12th.     PHYSICAL EXAM:  Blood pressure (!) 135/51, pulse 60, temperature 97.8 F (36.6 C), temperature source Oral, resp. rate 16, height 5\' 2"  (1.575 m), weight 137 lb 14.4 oz (62.6 kg), SpO2 100%. Wt Readings from Last 3 Encounters:  02/07/24 137 lb 14.4 oz (62.6 kg)  01/02/24 139 lb 9.6 oz (63.3 kg)  11/19/23 135 lb 12.8 oz (61.6 kg)   Body mass index is 25.22 kg/m. Performance status (ECOG): 0 - Asymptomatic Physical Exam Constitutional:      Appearance: Normal appearance.  HENT:     Mouth/Throat:     Pharynx: Oropharynx is clear. No oropharyngeal exudate.  Cardiovascular:     Rate and Rhythm: Normal rate and regular rhythm.     Heart sounds: No murmur heard.    No friction rub. No gallop.  Pulmonary:     Breath sounds: Normal breath sounds.  Chest:  Breasts:    Right: No swelling, bleeding, inverted nipple, mass, nipple discharge or skin change.     Left: No swelling, bleeding, inverted  nipple, mass, nipple discharge or skin change.  Abdominal:     General: Bowel sounds are normal. There is no distension.     Palpations: Abdomen is soft. There is no mass.     Tenderness: There is no abdominal tenderness.  Musculoskeletal:        General: No tenderness.     Cervical back: Normal range of motion and neck supple.     Right lower leg: No edema.     Left lower leg: No edema.  Lymphadenopathy:     Cervical: No cervical adenopathy.     Right cervical: No superficial, deep or posterior cervical adenopathy.    Left cervical: No superficial, deep or posterior cervical adenopathy.     Upper Body:     Right upper body: No supraclavicular or axillary adenopathy.     Left upper body: No supraclavicular or axillary adenopathy.     Lower Body: No right inguinal adenopathy. No left inguinal adenopathy.  Skin:    Coloration: Skin is not jaundiced.     Findings: No lesion or rash.  Neurological:     General: No focal deficit present.     Mental Status: She is alert and oriented to person, place, and time. Mental status is at baseline.  Psychiatric:        Mood and Affect: Mood normal.        Behavior: Behavior  normal.        Thought Content: Thought content normal.        Judgment: Judgment normal.    LABS:      Latest Ref Rng & Units 01/15/2024    9:58 AM 11/19/2023    3:02 PM  CBC  WBC 4.0 - 10.5 K/uL 5.8  6.3   Hemoglobin 12.0 - 15.0 g/dL 16.1  09.6   Hematocrit 36.0 - 46.0 % 38.9  39.4   Platelets 150 - 400 K/uL 172  185       Latest Ref Rng & Units 01/15/2024    9:58 AM 11/19/2023    3:02 PM  CMP  Glucose 70 - 99 mg/dL 93  98   BUN 8 - 23 mg/dL 12  12   Creatinine 0.45 - 1.00 mg/dL 4.09  8.11   Sodium 914 - 145 mmol/L 137  138   Potassium 3.5 - 5.1 mmol/L 3.9  3.9   Chloride 98 - 111 mmol/L 100  103   CO2 22 - 32 mmol/L 25  25   Calcium 8.9 - 10.3 mg/dL 9.3  9.6   Total Protein 6.5 - 8.1 g/dL 7.1  7.0   Total Bilirubin 0.0 - 1.2 mg/dL 0.9  0.4   Alkaline  Phos 38 - 126 U/L 47  43   AST 15 - 41 U/L 22  25   ALT 0 - 44 U/L 13  12     ASSESSMENT & PLAN:   Andrea Cunningham is an 83 y.o. female with stage IB (T1c N0 M0) triple negative breast cancer.  As mentioned previously, she is currently taking Xeloda for her adjuvant therapy.  I am very pleased with how well she appears to have tolerated her first cycle of this oral agent.  She will start her second cycle of oral Xeloda on Wednesday, March 12th.  I will see her back in early April 2025 before she heads into her third of 4 planned cycles of oral Xeloda chemotherapy.  The patient and her family understand all the plans discussed today and are in agreement with them.  Sahej Schrieber Kirby Funk, MD

## 2024-02-07 ENCOUNTER — Telehealth: Payer: Self-pay | Admitting: Oncology

## 2024-02-07 ENCOUNTER — Inpatient Hospital Stay: Payer: Medicare HMO | Attending: Oncology | Admitting: Oncology

## 2024-02-07 ENCOUNTER — Inpatient Hospital Stay: Payer: Medicare HMO

## 2024-02-07 ENCOUNTER — Other Ambulatory Visit: Payer: Self-pay | Admitting: Oncology

## 2024-02-07 VITALS — BP 135/51 | HR 60 | Temp 97.8°F | Resp 16 | Ht 62.0 in | Wt 137.9 lb

## 2024-02-07 DIAGNOSIS — C50411 Malignant neoplasm of upper-outer quadrant of right female breast: Secondary | ICD-10-CM

## 2024-02-07 DIAGNOSIS — Z79899 Other long term (current) drug therapy: Secondary | ICD-10-CM | POA: Insufficient documentation

## 2024-02-07 DIAGNOSIS — C50812 Malignant neoplasm of overlapping sites of left female breast: Secondary | ICD-10-CM | POA: Diagnosis not present

## 2024-02-07 DIAGNOSIS — Z171 Estrogen receptor negative status [ER-]: Secondary | ICD-10-CM | POA: Insufficient documentation

## 2024-02-07 DIAGNOSIS — C50919 Malignant neoplasm of unspecified site of unspecified female breast: Secondary | ICD-10-CM

## 2024-02-07 NOTE — Telephone Encounter (Signed)
 02/07/24 Spoke with patient and confirmed next appt.

## 2024-02-07 NOTE — Progress Notes (Signed)
 Face to face visit with pt and her daughter in lobby of Cancer Center. Pt is here for her Med Onc follow up. Pt reports that she has done great with the oral chemo and is having no problems at all so far. Reminded pt of breast cancer support group.

## 2024-02-11 ENCOUNTER — Telehealth: Payer: Self-pay

## 2024-02-11 NOTE — Telephone Encounter (Signed)
 Pt states she is doing good and hasn't had any bad side effects at all. She starts the 2nd cycle in the morning. She takes Xeloda 2 tabs in the morning and 3 tabs in the evening. No missed doses. She denies mouth sores, N/V, heartburn, cough, SOB, chest pain, diarrhea, and no skin irritation or color change in hands and feet. I reminded her to call us if she were to develop temp of 100.4 or higher, day or night. I also encouraged her to call us with any questions or concerns or if she feels she is developing an infection of some sort. She verbalized understanding. We confirmed her next scheduled appt.

## 2024-02-19 ENCOUNTER — Other Ambulatory Visit: Payer: Self-pay

## 2024-02-19 ENCOUNTER — Telehealth: Payer: Self-pay

## 2024-02-19 NOTE — Progress Notes (Signed)
 Specialty Pharmacy Refill Coordination Note  AUDRA KAGEL is a 83 y.o. female contacted today regarding refills of specialty medication(s) Capecitabine (XELODA)   Patient requested Delivery   Delivery date: 02/26/24   Verified address: [address]  [place] [address]-   Medication will be filled on 03.25.25.

## 2024-02-19 NOTE — Telephone Encounter (Addendum)
 Pt is taking the Xeloda @ 9am, and again @ 6pm. No missed doses. "I haven't had any of the bad side effects that I was told I could have. I'm thankful for that". Denies SOB, cough, heartburn, diarrhea, hand/foot syndrome, fever, chills, cough, and chest pain. She has constipation (baseline) and takes magnesium 500mg  q HS and it works great for her.  I reminded her to call us if she were to develop temp of 100.4 or higher, day or night. She verbalized understanding.

## 2024-02-25 ENCOUNTER — Other Ambulatory Visit: Payer: Self-pay

## 2024-02-25 ENCOUNTER — Other Ambulatory Visit (HOSPITAL_COMMUNITY): Payer: Self-pay

## 2024-03-03 DIAGNOSIS — E782 Mixed hyperlipidemia: Secondary | ICD-10-CM | POA: Diagnosis not present

## 2024-03-03 DIAGNOSIS — M810 Age-related osteoporosis without current pathological fracture: Secondary | ICD-10-CM | POA: Diagnosis not present

## 2024-03-03 NOTE — Progress Notes (Unsigned)
 [facility]  [address] [place],  [place]  [address] [phone]  Clinic Day:  02/07/2024  Referring physician: Abner Greenspan, MD   HISTORY OF PRESENT ILLNESS:  Andrea Cunningham is an 83 y.o. female with stage IB (T1c N0 M0) triple negative breast cancer, status post a left breast lumpectomy in January 2025.  Despite receptor testing showing this tumor to be estrogen, progesterone and Her2/neu receptor negative, her Ki-67 score was only 5%.  After extensively reviewing her case and evaluating treatment options, the plan was ultimately decided to place her on oral Xeloda chemotherapy for her adjuvant therapy.  She comes in today to be evaluated before heading into her 3rd cycle of adjuvant Xeloda, which she takes at 1000 mg in the morning and 1500 mg at night, every 2/3 weeks.  The patient claims to have tolerated her 2nd cycle of Xeloda extremely well.  She denied having any of the mouth sores, diarrhea, or other symptoms that go along with this agent.    PHYSICAL EXAM:  There were no vitals taken for this visit. Wt Readings from Last 3 Encounters:  02/07/24 137 lb 14.4 oz (62.6 kg)  01/02/24 139 lb 9.6 oz (63.3 kg)  11/19/23 135 lb 12.8 oz (61.6 kg)   There is no height or weight on file to calculate BMI. Performance status (ECOG): 0 - Asymptomatic Physical Exam Constitutional:      Appearance: Normal appearance.  HENT:     Mouth/Throat:     Pharynx: Oropharynx is clear. No oropharyngeal exudate.  Cardiovascular:     Rate and Rhythm: Normal rate and regular rhythm.     Heart sounds: No murmur heard.    No friction rub. No gallop.  Pulmonary:     Breath sounds: Normal breath sounds.  Chest:  Breasts:    Right: No swelling, bleeding, inverted nipple, mass, nipple discharge or skin change.     Left: No swelling, bleeding, inverted nipple, mass, nipple discharge or skin change.  Abdominal:     General: Bowel sounds are normal. There is no  distension.     Palpations: Abdomen is soft. There is no mass.     Tenderness: There is no abdominal tenderness.  Musculoskeletal:        General: No tenderness.     Cervical back: Normal range of motion and neck supple.     Right lower leg: No edema.     Left lower leg: No edema.  Lymphadenopathy:     Cervical: No cervical adenopathy.     Right cervical: No superficial, deep or posterior cervical adenopathy.    Left cervical: No superficial, deep or posterior cervical adenopathy.     Upper Body:     Right upper body: No supraclavicular or axillary adenopathy.     Left upper body: No supraclavicular or axillary adenopathy.     Lower Body: No right inguinal adenopathy. No left inguinal adenopathy.  Skin:    Coloration: Skin is not jaundiced.     Findings: No lesion or rash.  Neurological:     General: No focal deficit present.     Mental Status: She is alert and oriented to person, place, and time. Mental status is at baseline.  Psychiatric:        Mood and Affect: Mood normal.        Behavior: Behavior normal.        Thought Content: Thought content normal.        Judgment:  Judgment normal.   LABS:      Latest Ref Rng & Units 01/15/2024    9:58 AM 11/19/2023    3:02 PM  CBC  WBC 4.0 - 10.5 K/uL 5.8  6.3   Hemoglobin 12.0 - 15.0 g/dL 16.1  09.6   Hematocrit 36.0 - 46.0 % 38.9  39.4   Platelets 150 - 400 K/uL 172  185       Latest Ref Rng & Units 01/15/2024    9:58 AM 11/19/2023    3:02 PM  CMP  Glucose 70 - 99 mg/dL 93  98   BUN 8 - 23 mg/dL 12  12   Creatinine 0.45 - 1.00 mg/dL 4.09  8.11   Sodium 914 - 145 mmol/L 137  138   Potassium 3.5 - 5.1 mmol/L 3.9  3.9   Chloride 98 - 111 mmol/L 100  103   CO2 22 - 32 mmol/L 25  25   Calcium 8.9 - 10.3 mg/dL 9.3  9.6   Total Protein 6.5 - 8.1 g/dL 7.1  7.0   Total Bilirubin 0.0 - 1.2 mg/dL 0.9  0.4   Alkaline Phos 38 - 126 U/L 47  43   AST 15 - 41 U/L 22  25   ALT 0 - 44 U/L 13  12     ASSESSMENT & PLAN:   Andrea Cunningham is an 83 y.o. female with stage IB (T1c N0 M0) triple negative breast cancer.  As mentioned previously, she is currently taking Xeloda for her adjuvant therapy.  I am very pleased with how well she appears to have tolerated her first cycle of this oral agent.  She will start her second cycle of oral Xeloda on Wednesday, March 12th.  I will see her back in early April 2025 before she heads into her third of 4 planned cycles of oral Xeloda chemotherapy.  The patient and her family understand all the plans discussed today and are in agreement with them.  Delvecchio Madole Kirby Funk, MD

## 2024-03-04 ENCOUNTER — Telehealth: Payer: Self-pay | Admitting: Oncology

## 2024-03-04 ENCOUNTER — Inpatient Hospital Stay

## 2024-03-04 ENCOUNTER — Inpatient Hospital Stay: Attending: Oncology | Admitting: Oncology

## 2024-03-04 ENCOUNTER — Other Ambulatory Visit: Payer: Self-pay | Admitting: Oncology

## 2024-03-04 VITALS — BP 123/61 | HR 61 | Temp 98.0°F | Resp 14 | Ht 62.0 in | Wt 133.6 lb

## 2024-03-04 DIAGNOSIS — C50812 Malignant neoplasm of overlapping sites of left female breast: Secondary | ICD-10-CM | POA: Insufficient documentation

## 2024-03-04 DIAGNOSIS — Z79899 Other long term (current) drug therapy: Secondary | ICD-10-CM | POA: Diagnosis not present

## 2024-03-04 DIAGNOSIS — C50412 Malignant neoplasm of upper-outer quadrant of left female breast: Secondary | ICD-10-CM | POA: Diagnosis not present

## 2024-03-04 DIAGNOSIS — Z171 Estrogen receptor negative status [ER-]: Secondary | ICD-10-CM | POA: Insufficient documentation

## 2024-03-04 DIAGNOSIS — C50919 Malignant neoplasm of unspecified site of unspecified female breast: Secondary | ICD-10-CM

## 2024-03-04 LAB — CBC WITH DIFFERENTIAL (CANCER CENTER ONLY)
Abs Immature Granulocytes: 0.01 10*3/uL (ref 0.00–0.07)
Basophils Absolute: 0 10*3/uL (ref 0.0–0.1)
Basophils Relative: 1 %
Eosinophils Absolute: 0.1 10*3/uL (ref 0.0–0.5)
Eosinophils Relative: 3 %
HCT: 36.1 % (ref 36.0–46.0)
Hemoglobin: 12.2 g/dL (ref 12.0–15.0)
Immature Granulocytes: 0 %
Lymphocytes Relative: 29 %
Lymphs Abs: 1.4 10*3/uL (ref 0.7–4.0)
MCH: 31.2 pg (ref 26.0–34.0)
MCHC: 33.8 g/dL (ref 30.0–36.0)
MCV: 92.3 fL (ref 80.0–100.0)
Monocytes Absolute: 0.6 10*3/uL (ref 0.1–1.0)
Monocytes Relative: 12 %
Neutro Abs: 2.7 10*3/uL (ref 1.7–7.7)
Neutrophils Relative %: 55 %
Platelet Count: 166 10*3/uL (ref 150–400)
RBC: 3.91 MIL/uL (ref 3.87–5.11)
RDW: 18.1 % — ABNORMAL HIGH (ref 11.5–15.5)
WBC Count: 4.9 10*3/uL (ref 4.0–10.5)
nRBC: 0 % (ref 0.0–0.2)
nRBC: 0 /100{WBCs}

## 2024-03-04 LAB — CMP (CANCER CENTER ONLY)
ALT: 12 U/L (ref 0–44)
AST: 33 U/L (ref 15–41)
Albumin: 4.2 g/dL (ref 3.5–5.0)
Alkaline Phosphatase: 53 U/L (ref 38–126)
Anion gap: 11 (ref 5–15)
BUN: 6 mg/dL — ABNORMAL LOW (ref 8–23)
CO2: 25 mmol/L (ref 22–32)
Calcium: 9.5 mg/dL (ref 8.9–10.3)
Chloride: 102 mmol/L (ref 98–111)
Creatinine: 0.89 mg/dL (ref 0.44–1.00)
GFR, Estimated: >60 mL/min (ref >60)
Glucose, Bld: 101 mg/dL — ABNORMAL HIGH (ref 70–99)
Potassium: 4.6 mmol/L (ref 3.5–5.1)
Sodium: 138 mmol/L (ref 135–145)
Total Bilirubin: 0.9 mg/dL (ref 0.0–1.2)
Total Protein: 6.8 g/dL (ref 6.5–8.1)

## 2024-03-04 NOTE — Telephone Encounter (Signed)
 Patient has been scheduled for follow-up visit per 03/04/24 LOS.  Pt given an appt calendar with date and time.

## 2024-03-11 ENCOUNTER — Telehealth: Payer: Self-pay

## 2024-03-11 NOTE — Telephone Encounter (Signed)
 I spoke with pt. She is doing well. The only thing she has noticed since starting the Xeloda is being tired. She is taking the Xeloda @ 9am, and again 6pm. No missed doses. She denies fever, chills, mouth sores, diarrhea and hand/foot syndrome. Her "appetite is ok. I eat at a little at the time. I munch". I reminded pt to call us if she were to develop temp of 100.4 or higher, day or night. She verbalized understanding. We confirmed next appt, 03/25/2024 @ 1115.

## 2024-03-17 ENCOUNTER — Other Ambulatory Visit: Payer: Self-pay

## 2024-03-17 ENCOUNTER — Other Ambulatory Visit (HOSPITAL_COMMUNITY): Payer: Self-pay

## 2024-03-17 ENCOUNTER — Other Ambulatory Visit: Payer: Self-pay | Admitting: Pharmacy Technician

## 2024-03-17 NOTE — Progress Notes (Signed)
 Specialty Pharmacy Refill Coordination Note  Andrea Cunningham is a 83 y.o. female contacted today regarding refills of specialty medication(s) Capecitabine (XELODA)   Patient requested Delivery   Delivery date: 03/20/24   Verified address: [address] [name] [address]   Medication will be filled on 03/19/24.

## 2024-03-19 ENCOUNTER — Other Ambulatory Visit: Payer: Self-pay

## 2024-03-24 NOTE — Progress Notes (Unsigned)
 [facility]  [address] [place],  [place]  [address] [phone]  Clinic Day:  03/25/2024  Referring physician: Lonie Roa, MD   HISTORY OF PRESENT ILLNESS:  Andrea Cunningham is an 83 y.o. female with stage IB (T1c N0 M0) triple negative breast cancer, status post a left breast lumpectomy in January 2025.  Despite receptor testing showing this tumor to be estrogen, progesterone and Her2/neu receptor negative, her Ki-67 score was only 5%.  After extensively reviewing her case and evaluating treatment options, the plan was ultimately decided to place her on oral Xeloda  chemotherapy for her adjuvant management.  She comes in today to be evaluated before heading into her 4th and final cycle of adjuvant Xeloda , which she takes at 1000 mg in the morning and 1500 mg at night, every 2/3 weeks.  The patient claims to have tolerated her 3rd cycle of Xeloda  fairly well.  The only issue she has had is progressive fatigue with each successive cycle of oral therapy.  She denies having any of the mouth sores, diarrhea, or other symptoms that can go along with taking this agent.  She also denies having any changes in her breasts which concern her for early disease recurrence.  PHYSICAL EXAM:  There were no vitals taken for this visit. Wt Readings from Last 3 Encounters:  03/04/24 133 lb 9.6 oz (60.6 kg)  02/07/24 137 lb 14.4 oz (62.6 kg)  01/02/24 139 lb 9.6 oz (63.3 kg)   There is no height or weight on file to calculate BMI. Performance status (ECOG): 0 - Asymptomatic Physical Exam Constitutional:      Appearance: Normal appearance.  HENT:     Mouth/Throat:     Pharynx: Oropharynx is clear. No oropharyngeal exudate.  Cardiovascular:     Rate and Rhythm: Normal rate and regular rhythm.     Heart sounds: No murmur heard.    No friction rub. No gallop.  Pulmonary:     Breath sounds: Normal breath sounds.  Chest:  Breasts:    Right: No swelling, bleeding,  inverted nipple, mass, nipple discharge or skin change.     Left: No swelling, bleeding, inverted nipple, mass, nipple discharge or skin change.  Abdominal:     General: Bowel sounds are normal. There is no distension.     Palpations: Abdomen is soft. There is no mass.     Tenderness: There is no abdominal tenderness.  Musculoskeletal:        General: No tenderness.     Cervical back: Normal range of motion and neck supple.     Right lower leg: No edema.     Left lower leg: No edema.  Lymphadenopathy:     Cervical: No cervical adenopathy.     Right cervical: No superficial, deep or posterior cervical adenopathy.    Left cervical: No superficial, deep or posterior cervical adenopathy.     Upper Body:     Right upper body: No supraclavicular or axillary adenopathy.     Left upper body: No supraclavicular or axillary adenopathy.     Lower Body: No right inguinal adenopathy. No left inguinal adenopathy.  Skin:    Coloration: Skin is not jaundiced.     Findings: No lesion or rash.  Neurological:     General: No focal deficit present.     Mental Status: She is alert and oriented to person, place, and time. Mental status is at baseline.  Psychiatric:  Mood and Affect: Mood normal.        Behavior: Behavior normal.        Thought Content: Thought content normal.        Judgment: Judgment normal.    LABS:      Latest Ref Rng & Units 03/04/2024   10:26 AM 01/15/2024    9:58 AM 11/19/2023    3:02 PM  CBC  WBC 4.0 - 10.5 K/uL 4.9  5.8  6.3   Hemoglobin 12.0 - 15.0 g/dL 16.1  09.6  04.5   Hematocrit 36.0 - 46.0 % 36.1  38.9  39.4   Platelets 150 - 400 K/uL 166  172  185       Latest Ref Rng & Units 03/04/2024   10:26 AM 01/15/2024    9:58 AM 11/19/2023    3:02 PM  CMP  Glucose 70 - 99 mg/dL 409  93  98   BUN 8 - 23 mg/dL 6  12  12    Creatinine 0.44 - 1.00 mg/dL 8.11  9.14  7.82   Sodium 135 - 145 mmol/L 138  137  138   Potassium 3.5 - 5.1 mmol/L 4.6  3.9  3.9   Chloride 98  - 111 mmol/L 102  100  103   CO2 22 - 32 mmol/L 25  25  25    Calcium 8.9 - 10.3 mg/dL 9.5  9.3  9.6   Total Protein 6.5 - 8.1 g/dL 6.8  7.1  7.0   Total Bilirubin 0.0 - 1.2 mg/dL 0.9  0.9  0.4   Alkaline Phos 38 - 126 U/L 53  47  43   AST 15 - 41 U/L 33  22  25   ALT 0 - 44 U/L 12  13  12      ASSESSMENT & PLAN:   Andrea Cunningham is an 83 y.o. female with stage IB (T1c N0 M0) triple negative breast cancer.  As mentioned previously, she is currently taking Xeloda  for her adjuvant therapy.   She will proceed with her 4th and final cycle of oral Xeloda  today.  Clinically, she is doing very well.  I reassured the patient that it is not uncommon to have fatigue as one progresses through multiple cycles of therapy.  I anticipate her fatigue to be much improved in a matter of weeks after she finishes her last cycle of Xeloda .  I will see her back in 2 months for repeat clinical assessment.  The patient understands all the plans discussed today and is in agreement with them.  Damoney Julia Felicia Horde, MD

## 2024-03-25 ENCOUNTER — Inpatient Hospital Stay

## 2024-03-25 ENCOUNTER — Inpatient Hospital Stay: Admitting: Oncology

## 2024-03-25 ENCOUNTER — Other Ambulatory Visit: Payer: Self-pay | Admitting: Oncology

## 2024-03-25 VITALS — BP 136/72 | HR 52 | Temp 98.1°F | Resp 16 | Ht 62.0 in | Wt 134.0 lb

## 2024-03-25 DIAGNOSIS — Z171 Estrogen receptor negative status [ER-]: Secondary | ICD-10-CM

## 2024-03-25 DIAGNOSIS — Z95828 Presence of other vascular implants and grafts: Secondary | ICD-10-CM

## 2024-03-25 DIAGNOSIS — C50919 Malignant neoplasm of unspecified site of unspecified female breast: Secondary | ICD-10-CM

## 2024-03-25 DIAGNOSIS — C50412 Malignant neoplasm of upper-outer quadrant of left female breast: Secondary | ICD-10-CM | POA: Diagnosis not present

## 2024-03-25 DIAGNOSIS — C50812 Malignant neoplasm of overlapping sites of left female breast: Secondary | ICD-10-CM | POA: Diagnosis not present

## 2024-03-25 DIAGNOSIS — Z79899 Other long term (current) drug therapy: Secondary | ICD-10-CM | POA: Diagnosis not present

## 2024-03-25 LAB — CMP (CANCER CENTER ONLY)
ALT: 13 U/L (ref 0–44)
AST: 27 U/L (ref 15–41)
Albumin: 4.2 g/dL (ref 3.5–5.0)
Alkaline Phosphatase: 45 U/L (ref 38–126)
Anion gap: 9 (ref 5–15)
BUN: 7 mg/dL — ABNORMAL LOW (ref 8–23)
CO2: 25 mmol/L (ref 22–32)
Calcium: 8.9 mg/dL (ref 8.9–10.3)
Chloride: 104 mmol/L (ref 98–111)
Creatinine: 0.78 mg/dL (ref 0.44–1.00)
GFR, Estimated: >60 mL/min (ref >60)
Glucose, Bld: 106 mg/dL — ABNORMAL HIGH (ref 70–99)
Potassium: 3.9 mmol/L (ref 3.5–5.1)
Sodium: 138 mmol/L (ref 135–145)
Total Bilirubin: 0.5 mg/dL (ref 0.0–1.2)
Total Protein: 6.9 g/dL (ref 6.5–8.1)

## 2024-03-25 LAB — CBC WITH DIFFERENTIAL (CANCER CENTER ONLY)
Abs Immature Granulocytes: 0.02 10*3/uL (ref 0.00–0.07)
Basophils Absolute: 0 10*3/uL (ref 0.0–0.1)
Basophils Relative: 0 %
Eosinophils Absolute: 0.2 10*3/uL (ref 0.0–0.5)
Eosinophils Relative: 2 %
HCT: 34 % — ABNORMAL LOW (ref 36.0–46.0)
Hemoglobin: 11.5 g/dL — ABNORMAL LOW (ref 12.0–15.0)
Immature Granulocytes: 0 %
Lymphocytes Relative: 26 %
Lymphs Abs: 1.8 10*3/uL (ref 0.7–4.0)
MCH: 32.9 pg (ref 26.0–34.0)
MCHC: 33.8 g/dL (ref 30.0–36.0)
MCV: 97.1 fL (ref 80.0–100.0)
Monocytes Absolute: 0.6 10*3/uL (ref 0.1–1.0)
Monocytes Relative: 8 %
Neutro Abs: 4.4 10*3/uL (ref 1.7–7.7)
Neutrophils Relative %: 64 %
Platelet Count: 168 10*3/uL (ref 150–400)
RBC: 3.5 MIL/uL — ABNORMAL LOW (ref 3.87–5.11)
RDW: 20.9 % — ABNORMAL HIGH (ref 11.5–15.5)
WBC Count: 7 10*3/uL (ref 4.0–10.5)
nRBC: 0 % (ref 0.0–0.2)
nRBC: 0 /100{WBCs}

## 2024-03-25 MED ORDER — HEPARIN SOD (PORK) LOCK FLUSH 100 UNIT/ML IV SOLN
500.0000 [IU] | Freq: Once | INTRAVENOUS | Status: AC
Start: 2024-03-25 — End: 2024-03-25
  Administered 2024-03-25: 500 [IU] via INTRAVENOUS

## 2024-03-25 MED ORDER — SODIUM CHLORIDE 0.9% FLUSH
10.0000 mL | INTRAVENOUS | Status: DC | PRN
  Administered 2024-03-25: 10 mL via INTRAVENOUS

## 2024-03-25 MED ORDER — HEPARIN NA (PORK) LOCK FLSH PF 10 UNIT/ML IV SOLN
10.0000 [IU] | Freq: Once | INTRAVENOUS | Status: DC
Start: 2024-03-25 — End: 2024-03-25

## 2024-03-25 NOTE — Progress Notes (Signed)
 Face t oface contact with patient in lobby. Pt is here for follow up with Dr. Harles Lied. Pt reports that she is doing well with her treatments and is happy to be nearing the completion of her treatments.

## 2024-04-01 ENCOUNTER — Telehealth: Payer: Self-pay | Admitting: Dietician

## 2024-04-01 NOTE — Telephone Encounter (Signed)
 Reached out to patient after meeting with her briefly at the Nutrition Open house yesterday. Offered a remote nutrition consult to chat about her recent weight loss. Patient declined at this time. She has one more treatment left and she feels she can manage.  Relayed information about the breast cancer support group and that I would be speaking in June about AICR guidelines and risk reduction and encourage participation.  Carleen Chary, RDN, LDN Registered Dietitian, [facility] Part Time Remote (Usual office hours: Tuesday-Thursday) Mobile: [phone] Remote Office: [phone]

## 2024-04-02 ENCOUNTER — Other Ambulatory Visit (HOSPITAL_COMMUNITY): Payer: Self-pay

## 2024-04-02 DIAGNOSIS — M810 Age-related osteoporosis without current pathological fracture: Secondary | ICD-10-CM | POA: Diagnosis not present

## 2024-04-02 DIAGNOSIS — E782 Mixed hyperlipidemia: Secondary | ICD-10-CM | POA: Diagnosis not present

## 2024-04-06 ENCOUNTER — Other Ambulatory Visit: Payer: Self-pay

## 2024-04-06 NOTE — Progress Notes (Signed)
 Per patient and visit notes with Dr. Harles Lied from 4/23, patient has completed final cycle of Xeloda . Disenrolled.

## 2024-04-10 DIAGNOSIS — M069 Rheumatoid arthritis, unspecified: Secondary | ICD-10-CM | POA: Diagnosis not present

## 2024-04-10 DIAGNOSIS — J449 Chronic obstructive pulmonary disease, unspecified: Secondary | ICD-10-CM | POA: Diagnosis not present

## 2024-04-10 DIAGNOSIS — R030 Elevated blood-pressure reading, without diagnosis of hypertension: Secondary | ICD-10-CM | POA: Diagnosis not present

## 2024-04-10 DIAGNOSIS — Z853 Personal history of malignant neoplasm of breast: Secondary | ICD-10-CM | POA: Diagnosis not present

## 2024-04-10 DIAGNOSIS — M1990 Unspecified osteoarthritis, unspecified site: Secondary | ICD-10-CM | POA: Diagnosis not present

## 2024-04-10 DIAGNOSIS — Z79620 Long term (current) use of immunosuppressive biologic: Secondary | ICD-10-CM | POA: Diagnosis not present

## 2024-04-10 DIAGNOSIS — N182 Chronic kidney disease, stage 2 (mild): Secondary | ICD-10-CM | POA: Diagnosis not present

## 2024-04-10 DIAGNOSIS — I739 Peripheral vascular disease, unspecified: Secondary | ICD-10-CM | POA: Diagnosis not present

## 2024-04-10 DIAGNOSIS — M810 Age-related osteoporosis without current pathological fracture: Secondary | ICD-10-CM | POA: Diagnosis not present

## 2024-04-10 DIAGNOSIS — H3530 Unspecified macular degeneration: Secondary | ICD-10-CM | POA: Diagnosis not present

## 2024-04-10 DIAGNOSIS — Z809 Family history of malignant neoplasm, unspecified: Secondary | ICD-10-CM | POA: Diagnosis not present

## 2024-04-10 DIAGNOSIS — E785 Hyperlipidemia, unspecified: Secondary | ICD-10-CM | POA: Diagnosis not present

## 2024-04-22 DIAGNOSIS — M810 Age-related osteoporosis without current pathological fracture: Secondary | ICD-10-CM | POA: Diagnosis not present

## 2024-04-22 DIAGNOSIS — Z6824 Body mass index (BMI) 24.0-24.9, adult: Secondary | ICD-10-CM | POA: Diagnosis not present

## 2024-04-22 DIAGNOSIS — R7989 Other specified abnormal findings of blood chemistry: Secondary | ICD-10-CM | POA: Diagnosis not present

## 2024-04-22 DIAGNOSIS — M1991 Primary osteoarthritis, unspecified site: Secondary | ICD-10-CM | POA: Diagnosis not present

## 2024-04-22 DIAGNOSIS — M0579 Rheumatoid arthritis with rheumatoid factor of multiple sites without organ or systems involvement: Secondary | ICD-10-CM | POA: Diagnosis not present

## 2024-05-03 DIAGNOSIS — E782 Mixed hyperlipidemia: Secondary | ICD-10-CM | POA: Diagnosis not present

## 2024-05-03 DIAGNOSIS — M810 Age-related osteoporosis without current pathological fracture: Secondary | ICD-10-CM | POA: Diagnosis not present

## 2024-05-06 DIAGNOSIS — L820 Inflamed seborrheic keratosis: Secondary | ICD-10-CM | POA: Diagnosis not present

## 2024-05-13 DIAGNOSIS — E782 Mixed hyperlipidemia: Secondary | ICD-10-CM | POA: Diagnosis not present

## 2024-05-24 NOTE — Progress Notes (Unsigned)
 [facility]  [address] [place],  [place]  [address] [phone]  Clinic Day:  05/25/2024  Referring physician: Trinidad Hun, MD   HISTORY OF PRESENT ILLNESS:  Andrea Cunningham is an 83 y.o. female with stage IB (T1c N0 M0) triple negative breast cancer, status post a left breast lumpectomy in January 2025.  Despite receptor testing showing this tumor to be estrogen, progesterone and Her2/neu receptor negative, her Ki-67 score was only 5%.  She completed 4 cycles of adjuvant oral Xeloda  in April 2025.  She comes in today for routine follow-up.  Since her last visit, the patient has been doing well.  Her fatigue has dissipated since she has completed her oral Xeloda  chemotherapy.  Other than lateral left breast discomfort, she denies having any changes in her breasts which concern her for early disease recurrence.  PHYSICAL EXAM:  Blood pressure (!) 131/56, pulse 70, temperature 97.9 F (36.6 C), temperature source Oral, resp. rate 14, height 5' 2 (1.575 m), weight 134 lb 3.2 oz (60.9 kg), SpO2 98%. Wt Readings from Last 3 Encounters:  05/25/24 134 lb 3.2 oz (60.9 kg)  03/25/24 134 lb (60.8 kg)  03/04/24 133 lb 9.6 oz (60.6 kg)   Body mass index is 24.55 kg/m. Performance status (ECOG): 0 - Asymptomatic Physical Exam Constitutional:      Appearance: Normal appearance.  HENT:     Mouth/Throat:     Pharynx: Oropharynx is clear. No oropharyngeal exudate.   Cardiovascular:     Rate and Rhythm: Normal rate and regular rhythm.     Heart sounds: No murmur heard.    No friction rub. No gallop.  Pulmonary:     Breath sounds: Normal breath sounds.  Chest:  Breasts:    Right: No swelling, bleeding, inverted nipple, mass, nipple discharge or skin change.     Left: No swelling, bleeding, inverted nipple, mass, nipple discharge or skin change.  Abdominal:     General: Bowel sounds are normal. There is no distension.     Palpations: Abdomen is soft.  There is no mass.     Tenderness: There is no abdominal tenderness.   Musculoskeletal:        General: No tenderness.     Cervical back: Normal range of motion and neck supple.     Right lower leg: No edema.     Left lower leg: No edema.  Lymphadenopathy:     Cervical: No cervical adenopathy.     Right cervical: No superficial, deep or posterior cervical adenopathy.    Left cervical: No superficial, deep or posterior cervical adenopathy.     Upper Body:     Right upper body: No supraclavicular or axillary adenopathy.     Left upper body: No supraclavicular or axillary adenopathy.     Lower Body: No right inguinal adenopathy. No left inguinal adenopathy.   Skin:    Coloration: Skin is not jaundiced.     Findings: No lesion or rash.   Neurological:     General: No focal deficit present.     Mental Status: She is alert and oriented to person, place, and time. Mental status is at baseline.   Psychiatric:        Mood and Affect: Mood normal.        Behavior: Behavior normal.        Thought Content: Thought content normal.        Judgment: Judgment normal.    LABS:  Latest Ref Rng & Units 05/25/2024    9:25 AM 03/25/2024   10:56 AM 03/04/2024   10:26 AM  CBC  WBC 4.0 - 10.5 K/uL 4.6  7.0  4.9   Hemoglobin 12.0 - 15.0 g/dL 87.3  88.4  87.7   Hematocrit 36.0 - 46.0 % 37.5  34.0  36.1   Platelets 150 - 400 K/uL 164  168  166       Latest Ref Rng & Units 05/25/2024    9:25 AM 03/25/2024   10:56 AM 03/04/2024   10:26 AM  CMP  Glucose 70 - 99 mg/dL 97  893  898   BUN 8 - 23 mg/dL 9  7  6    Creatinine 0.44 - 1.00 mg/dL 9.17  9.21  9.10   Sodium 135 - 145 mmol/L 135  138  138   Potassium 3.5 - 5.1 mmol/L 4.2  3.9  4.6   Chloride 98 - 111 mmol/L 102  104  102   CO2 22 - 32 mmol/L 24  25  25    Calcium 8.9 - 10.3 mg/dL 9.6  8.9  9.5   Total Protein 6.5 - 8.1 g/dL 7.1  6.9  6.8   Total Bilirubin 0.0 - 1.2 mg/dL 0.6  0.5  0.9   Alkaline Phos 38 - 126 U/L 49  45  53   AST 15 -  41 U/L 29  27  33   ALT 0 - 44 U/L 15  13  12     ASSESSMENT & PLAN:   Andrea Cunningham is an 83 y.o. female with stage IB (T1c N0 M0) triple negative breast cancer.  As mentioned previously, she completed her 4 cycles of oral Xeloda  therapy in April 2025.  Based upon her clinical breast exam today, the patient remains disease-free.  Clinically, she appears to be doing very well.  Moving forward, I will perform clinical breast exams every 4 months over these next few years to ensure there remains no obvious evidence of disease recurrence.  She will also continue to undergo yearly mammograms for her routine radiographic breast cancer surveillance.  I will see her back in October 2024 for her next clinical breast exam.  The patient understands all the plans discussed today and is in agreement with them.  Drexler Maland DELENA Kerns, MD

## 2024-05-25 ENCOUNTER — Inpatient Hospital Stay: Attending: Oncology | Admitting: Oncology

## 2024-05-25 ENCOUNTER — Inpatient Hospital Stay

## 2024-05-25 ENCOUNTER — Telehealth: Payer: Self-pay | Admitting: Oncology

## 2024-05-25 VITALS — BP 131/56 | HR 70 | Temp 97.9°F | Resp 14 | Ht 62.0 in | Wt 134.2 lb

## 2024-05-25 DIAGNOSIS — C50812 Malignant neoplasm of overlapping sites of left female breast: Secondary | ICD-10-CM | POA: Diagnosis not present

## 2024-05-25 DIAGNOSIS — C50412 Malignant neoplasm of upper-outer quadrant of left female breast: Secondary | ICD-10-CM

## 2024-05-25 DIAGNOSIS — Z171 Estrogen receptor negative status [ER-]: Secondary | ICD-10-CM | POA: Insufficient documentation

## 2024-05-25 DIAGNOSIS — C50919 Malignant neoplasm of unspecified site of unspecified female breast: Secondary | ICD-10-CM

## 2024-05-25 LAB — CMP (CANCER CENTER ONLY)
ALT: 15 U/L (ref 0–44)
AST: 29 U/L (ref 15–41)
Albumin: 4.3 g/dL (ref 3.5–5.0)
Alkaline Phosphatase: 49 U/L (ref 38–126)
Anion gap: 10 (ref 5–15)
BUN: 9 mg/dL (ref 8–23)
CO2: 24 mmol/L (ref 22–32)
Calcium: 9.6 mg/dL (ref 8.9–10.3)
Chloride: 102 mmol/L (ref 98–111)
Creatinine: 0.82 mg/dL (ref 0.44–1.00)
GFR, Estimated: >60 mL/min (ref >60)
Glucose, Bld: 97 mg/dL (ref 70–99)
Potassium: 4.2 mmol/L (ref 3.5–5.1)
Sodium: 135 mmol/L (ref 135–145)
Total Bilirubin: 0.6 mg/dL (ref 0.0–1.2)
Total Protein: 7.1 g/dL (ref 6.5–8.1)

## 2024-05-25 LAB — CBC WITH DIFFERENTIAL (CANCER CENTER ONLY)
Abs Immature Granulocytes: 0.01 10*3/uL (ref 0.00–0.07)
Basophils Absolute: 0.1 10*3/uL (ref 0.0–0.1)
Basophils Relative: 1 %
Eosinophils Absolute: 0.2 10*3/uL (ref 0.0–0.5)
Eosinophils Relative: 4 %
HCT: 37.5 % (ref 36.0–46.0)
Hemoglobin: 12.6 g/dL (ref 12.0–15.0)
Immature Granulocytes: 0 %
Lymphocytes Relative: 33 %
Lymphs Abs: 1.5 10*3/uL (ref 0.7–4.0)
MCH: 33.2 pg (ref 26.0–34.0)
MCHC: 33.6 g/dL (ref 30.0–36.0)
MCV: 98.9 fL (ref 80.0–100.0)
Monocytes Absolute: 0.6 10*3/uL (ref 0.1–1.0)
Monocytes Relative: 13 %
Neutro Abs: 2.3 10*3/uL (ref 1.7–7.7)
Neutrophils Relative %: 49 %
Platelet Count: 164 10*3/uL (ref 150–400)
RBC: 3.79 MIL/uL — ABNORMAL LOW (ref 3.87–5.11)
RDW: 13.2 % (ref 11.5–15.5)
WBC Count: 4.6 10*3/uL (ref 4.0–10.5)
nRBC: 0 % (ref 0.0–0.2)

## 2024-05-25 NOTE — Progress Notes (Signed)
 Face to face contact with pt and her daughter in [facility]. Pt is here today for Med Onc follow up with Dr Ezzard.Pt reports that she is feeling well and is excited to be at the end of her chemotherapy.

## 2024-05-25 NOTE — Telephone Encounter (Signed)
 Patient has been scheduled for follow-up visit per 05/25/24 LOS.  Pt given an appt calendar with date and time.

## 2024-05-26 ENCOUNTER — Telehealth: Payer: Self-pay | Admitting: Oncology

## 2024-05-26 NOTE — Telephone Encounter (Signed)
 Madelyn W., from [facility] called to let us  know pt has Osteoporosis and is unable to afford Prolia  injection. Dr. Landry Georgi wanted to see if we can arrange pt to receive Reclast at our facility.  If it is too expensive or insurance does not cover the Reclast then they are fine with us  prescribing Alendronate.

## 2024-06-02 DIAGNOSIS — E782 Mixed hyperlipidemia: Secondary | ICD-10-CM | POA: Diagnosis not present

## 2024-06-02 DIAGNOSIS — M810 Age-related osteoporosis without current pathological fracture: Secondary | ICD-10-CM | POA: Diagnosis not present

## 2024-06-22 ENCOUNTER — Encounter: Payer: Self-pay | Admitting: Physician Assistant

## 2024-06-22 ENCOUNTER — Telehealth: Payer: Self-pay

## 2024-06-22 ENCOUNTER — Other Ambulatory Visit: Payer: Self-pay | Admitting: Hematology and Oncology

## 2024-06-22 DIAGNOSIS — K5909 Other constipation: Secondary | ICD-10-CM | POA: Diagnosis not present

## 2024-06-22 DIAGNOSIS — M810 Age-related osteoporosis without current pathological fracture: Secondary | ICD-10-CM | POA: Diagnosis not present

## 2024-06-22 DIAGNOSIS — Z6824 Body mass index (BMI) 24.0-24.9, adult: Secondary | ICD-10-CM | POA: Diagnosis not present

## 2024-06-22 MED ORDER — ALENDRONATE SODIUM 70 MG PO TABS: 70.0000 mg | ORAL_TABLET | ORAL | 0 refills | Status: DC

## 2024-06-22 NOTE — Telephone Encounter (Signed)
 Andrea W.,called from [facility] to check to see if anything has been done with this. I notified her that I would speak with the nurse to discuss this since I do not see any replies back.

## 2024-06-22 NOTE — Telephone Encounter (Signed)
 Spoke with Eleanor Mouse NP.,  Dr. Ezzard recommended the Alendronate  sodium for this patient.    Patient aware and good with medication.

## 2024-06-23 ENCOUNTER — Telehealth: Payer: Self-pay

## 2024-06-23 NOTE — Telephone Encounter (Signed)
 Dr. Landry Georgi,... requesting to speak with Dr. Ezzard about a pt as to why Fosamax  was prescribed instead of Reclast...  Per Dr. Ezzard, he chose Fosamax  for the convenience of the patient.

## 2024-07-01 DIAGNOSIS — H353132 Nonexudative age-related macular degeneration, bilateral, intermediate dry stage: Secondary | ICD-10-CM | POA: Diagnosis not present

## 2024-07-03 DIAGNOSIS — M810 Age-related osteoporosis without current pathological fracture: Secondary | ICD-10-CM | POA: Diagnosis not present

## 2024-07-03 DIAGNOSIS — E782 Mixed hyperlipidemia: Secondary | ICD-10-CM | POA: Diagnosis not present

## 2024-07-14 DIAGNOSIS — M810 Age-related osteoporosis without current pathological fracture: Secondary | ICD-10-CM | POA: Diagnosis not present

## 2024-08-03 DIAGNOSIS — E782 Mixed hyperlipidemia: Secondary | ICD-10-CM | POA: Diagnosis not present

## 2024-08-03 DIAGNOSIS — M810 Age-related osteoporosis without current pathological fracture: Secondary | ICD-10-CM | POA: Diagnosis not present

## 2024-08-12 DIAGNOSIS — M810 Age-related osteoporosis without current pathological fracture: Secondary | ICD-10-CM | POA: Diagnosis not present

## 2024-08-13 NOTE — Progress Notes (Unsigned)
 08/14/2024 Andrea Cunningham [id number] May 13, 1941  Referring provider: Trinidad Hun, MD Primary GI doctor: Dr. Charlanne  ASSESSMENT AND PLAN:  Lower AB/suprapubic AB pain 1 week severe pain, pain with BM, no fever, chills, no radiation, no weight loss, no blood in stool, no melena On magnesium nightly, small/thin stools but has daily normally Started on miralax for 2-3 days with improvement of her symptoms Recent severe lower abdominal pain with pencil-thin stools. Pain resolved with Miralax. Differential includes constipation due to incomplete bowel movements. No malignancy evidence, but change in habits requires further investigation. Declines colonoscopy - Start Miralax daily, even half a capful. - Order CT scan to rule out abdominal masses or abnormalities. - Advised use of squatty potty for bowel movements. - Discussed potential need for colonoscopy if symptoms worsen or new symptoms develop.  Internal hemorrhoids Internal hemorrhoids confirmed, likely affecting stool shape and causing discomfort. No external hemorrhoids or fissures. No blood in stool. - Prescribe suppositories and external cream. - Educated on hemorrhoids' role in stool shape alteration and discomfort. - Advised lifestyle modifications to reduce straining, including Miralax use and proper defecation positioning.     Personal history of colon polyps/history of colon cancer in mother 05/22/2019 colonoscopy good prep 2 TA polyps proximal ascending colon 6 to 8 mm, diverticula sigmoid colon, nonbleeding internal hemorrhoids Recall 5 years  History of breast cancer triple negative stage Ib Status post lumpectomy January 2025 Last seen oncology Dr. Ezzard 05/25/2024  Patient Care Team: Trinidad Hun, MD as PCP - General (Family Medicine) Corlis Joen BIRCH, RN as Oncology Nurse Navigator  HISTORY OF PRESENT ILLNESS: 83 y.o. female with a past medical history listed below presents for evaluation of AB pain.   Last seen  in the [facility] with Dr. Charlanne on 05/22/2019 during a colonoscopy for personal history of colon polyps.  Discussed the use of AI scribe software for clinical note transcription with the patient, who gave verbal consent to proceed.  History of Present Illness   Andrea Cunningham is an 83 year old female who presents with severe lower abdominal pain and changes in bowel habits. She was referred by Dr. Hun for evaluation of severe abdominal pain and bowel habit changes.  She experienced a week-long episode of severe pain in the lower abdomen, specifically below the umbilicus. The pain was described as 'horrible' and was exacerbated by walking and bowel movements, causing significant discomfort and tears due to its intensity.  She noted a change in her bowel habits, with stools becoming pencil-thin compared to her previous size. Despite taking a magnesium pill nightly to aid bowel movements, she has only one bowel movement per day, which she describes as formed but not hard or soft. She tried MiraLAX for two to three days, which coincided with the resolution of her pain.  No nausea, vomiting, fever, chills, weight loss, urinary symptoms, or changes in appetite. There is no history of recent travel, dietary changes, or decreased physical activity prior to the onset of symptoms.  Her past medical history includes breast cancer treated with lumpectomy and chemotherapy, and she recently received her first Reclast infusion for osteoporosis. She recently called her primary care doctor after her Reclast infusion to ask whether she should continue taking Fosamax , but has not yet received an answer. She has completed her chemotherapy regimen.  Her last colonoscopy in June 2020 revealed two tubular adenomatous polyps, diverticulosis, and internal hemorrhoids.        She  reports that she  has never smoked. She has never used smokeless tobacco. She reports current alcohol  use. She reports that she does not use  drugs.  RELEVANT GI HISTORY, IMAGING AND LABS: Results   DIAGNOSTIC Colonoscopy: Two tubular adenomatous polyps, diverticulosis, internal hemorrhoids (05/22/2019)      CBC    Component Value Date/Time   WBC 4.6 05/25/2024 0925   RBC 3.79 (L) 05/25/2024 0925   HGB 12.6 05/25/2024 0925   HCT 37.5 05/25/2024 0925   PLT 164 05/25/2024 0925   MCV 98.9 05/25/2024 0925   MCH 33.2 05/25/2024 0925   MCHC 33.6 05/25/2024 0925   RDW 13.2 05/25/2024 0925   LYMPHSABS 1.5 05/25/2024 0925   MONOABS 0.6 05/25/2024 0925   EOSABS 0.2 05/25/2024 0925   BASOSABS 0.1 05/25/2024 0925   Recent Labs    11/19/23 1502 01/15/24 0958 03/04/24 1026 03/25/24 1056 05/25/24 0925  HGB 13.6 13.4 12.2 11.5* 12.6    CMP     Component Value Date/Time   NA 135 05/25/2024 0925   K 4.2 05/25/2024 0925   CL 102 05/25/2024 0925   CO2 24 05/25/2024 0925   GLUCOSE 97 05/25/2024 0925   BUN 9 05/25/2024 0925   CREATININE 0.82 05/25/2024 0925   CALCIUM 9.6 05/25/2024 0925   PROT 7.1 05/25/2024 0925   ALBUMIN 4.3 05/25/2024 0925   AST 29 05/25/2024 0925   ALT 15 05/25/2024 0925   ALKPHOS 49 05/25/2024 0925   BILITOT 0.6 05/25/2024 0925   GFRNONAA >60 05/25/2024 0925      Latest Ref Rng & Units 05/25/2024    9:25 AM 03/25/2024   10:56 AM 03/04/2024   10:26 AM  Hepatic Function  Total Protein 6.5 - 8.1 g/dL 7.1  6.9  6.8   Albumin 3.5 - 5.0 g/dL 4.3  4.2  4.2   AST 15 - 41 U/L 29  27  33   ALT 0 - 44 U/L 15  13  12    Alk Phosphatase 38 - 126 U/L 49  45  53   Total Bilirubin 0.0 - 1.2 mg/dL 0.6  0.5  0.9       Current Medications:   Current Outpatient Medications (Endocrine & Metabolic):    alendronate  (FOSAMAX ) 70 MG tablet, Take 1 tablet (70 mg total) by mouth once a week. Take with a full glass of water on an empty stomach.   zoledronic acid (RECLAST) 5 MG/100ML SOLN injection, Inject 5 mg into the vein. yearly  Current Outpatient Medications (Cardiovascular):    rosuvastatin (CRESTOR) 5 MG  tablet, Take 5 mg by mouth once a week.   Current Outpatient Medications (Analgesics):    ENBREL SURECLICK 50 MG/ML injection, Inject into the skin.   Current Outpatient Medications (Other):    hydrocortisone  (ANUSOL -HC) 2.5 % rectal cream, Place 1 Application rectally 2 (two) times daily.   hydrocortisone  (ANUSOL -HC) 25 MG suppository, Place 1 suppository (25 mg total) rectally 2 (two) times daily.  Medical History:  Past Medical History:  Diagnosis Date   Anemia    past hx - not current    Arthritis    RA    Blood transfusion without reported diagnosis    Breast cancer (HCC)    Cancer (HCC)    skin cancer leg    Family history of colon cancer in mother    Heart murmur     leaking vlave per pt- benign    Osteoporosis    Tubular adenoma of colon    Allergies: No Known  Allergies   Surgical History:  She  has a past surgical history that includes Abdominal hysterectomy; oophrectomy; Colonoscopy; Polypectomy; benign cyst removed from breast ; Breast excisional biopsy (Bilateral); Breast biopsy (Left, 10/28/2023); Breast biopsy (Left, 10/28/2023); and Cataract extraction (Bilateral). Family History:  Her family history includes Arthritis in her father and mother; COPD in her sister; Colon cancer in her maternal aunt, maternal grandfather, maternal uncle, and mother; Colon polyps in an other family member; Lung cancer in her father; Other in her brother and sister.  REVIEW OF SYSTEMS  : All other systems reviewed and negative except where noted in the History of Present Illness.  PHYSICAL EXAM: BP 110/64   Pulse (!) 57   Ht 5' 1 (1.549 m)   Wt 133 lb (60.3 kg)   BMI 25.13 kg/m  Physical Exam   GENERAL APPEARANCE: Well nourished, in no apparent distress. HEENT: No cervical lymphadenopathy, unremarkable thyroid, sclerae anicteric, conjunctiva pink. RESPIRATORY: Respiratory effort normal, BS equal bilateral without rales, rhonchi, wheezing. CARDIO: RRR with no MRGs,  peripheral pulses intact. ABDOMEN: Soft, non distended, active bowel sounds in all 4 quadrants, no tenderness to palpation, no rebound, no mass appreciated. RECTAL: No fissures or masses on inspection. Small hemorrhoidal skin tag noted, normal and benign. Hemorrhoids present. Stool brown, no obvious blood. MUSCULOSKELETAL: Full ROM, normal gait, without edema. SKIN: Dry, intact without rashes or lesions. No jaundice. NEURO: Alert, oriented, no focal deficits. PSYCH: Cooperative, normal mood and affect.      Alan JONELLE Coombs, PA-C 11:39 AM

## 2024-08-14 ENCOUNTER — Encounter: Payer: Self-pay | Admitting: Physician Assistant

## 2024-08-14 ENCOUNTER — Ambulatory Visit: Payer: Self-pay | Admitting: Physician Assistant

## 2024-08-14 ENCOUNTER — Other Ambulatory Visit (INDEPENDENT_AMBULATORY_CARE_PROVIDER_SITE_OTHER)

## 2024-08-14 ENCOUNTER — Ambulatory Visit: Admitting: Physician Assistant

## 2024-08-14 VITALS — BP 110/64 | HR 57 | Ht 61.0 in | Wt 133.0 lb

## 2024-08-14 DIAGNOSIS — K649 Unspecified hemorrhoids: Secondary | ICD-10-CM

## 2024-08-14 DIAGNOSIS — Z853 Personal history of malignant neoplasm of breast: Secondary | ICD-10-CM | POA: Diagnosis not present

## 2024-08-14 DIAGNOSIS — R194 Change in bowel habit: Secondary | ICD-10-CM | POA: Diagnosis not present

## 2024-08-14 DIAGNOSIS — R1030 Lower abdominal pain, unspecified: Secondary | ICD-10-CM | POA: Diagnosis not present

## 2024-08-14 DIAGNOSIS — R1032 Left lower quadrant pain: Secondary | ICD-10-CM | POA: Diagnosis not present

## 2024-08-14 DIAGNOSIS — R1031 Right lower quadrant pain: Secondary | ICD-10-CM | POA: Diagnosis not present

## 2024-08-14 DIAGNOSIS — Z800 Family history of malignant neoplasm of digestive organs: Secondary | ICD-10-CM

## 2024-08-14 DIAGNOSIS — Z860100 Personal history of colon polyps, unspecified: Secondary | ICD-10-CM | POA: Diagnosis not present

## 2024-08-14 DIAGNOSIS — K5904 Chronic idiopathic constipation: Secondary | ICD-10-CM

## 2024-08-14 LAB — COMPREHENSIVE METABOLIC PANEL WITH GFR
ALT: 13 U/L (ref 0–35)
AST: 21 U/L (ref 0–37)
Albumin: 4.1 g/dL (ref 3.5–5.2)
Alkaline Phosphatase: 40 U/L (ref 39–117)
BUN: 9 mg/dL (ref 6–23)
CO2: 30 meq/L (ref 19–32)
Calcium: 9.5 mg/dL (ref 8.4–10.5)
Chloride: 102 meq/L (ref 96–112)
Creatinine, Ser: 0.84 mg/dL (ref 0.40–1.20)
GFR: 65.29 mL/min (ref >60.00)
Glucose, Bld: 90 mg/dL (ref 70–99)
Potassium: 4.9 meq/L (ref 3.5–5.1)
Sodium: 137 meq/L (ref 135–145)
Total Bilirubin: 0.9 mg/dL (ref 0.2–1.2)
Total Protein: 7.1 g/dL (ref 6.0–8.3)

## 2024-08-14 LAB — CBC WITH DIFFERENTIAL/PLATELET
Basophils Absolute: 0 K/uL (ref 0.0–0.1)
Basophils Relative: 0.9 % (ref 0.0–3.0)
Eosinophils Absolute: 0.1 K/uL (ref 0.0–0.7)
Eosinophils Relative: 2.2 % (ref 0.0–5.0)
HCT: 38.5 % (ref 36.0–46.0)
Hemoglobin: 12.9 g/dL (ref 12.0–15.0)
Lymphocytes Relative: 29.4 % (ref 12.0–46.0)
Lymphs Abs: 1.5 K/uL (ref 0.7–4.0)
MCHC: 33.5 g/dL (ref 30.0–36.0)
MCV: 88.3 fl (ref 78.0–100.0)
Monocytes Absolute: 0.6 K/uL (ref 0.1–1.0)
Monocytes Relative: 11.9 % (ref 3.0–12.0)
Neutro Abs: 2.9 K/uL (ref 1.4–7.7)
Neutrophils Relative %: 55.6 % (ref 43.0–77.0)
Platelets: 171 K/uL (ref 150.0–400.0)
RBC: 4.36 Mil/uL (ref 3.87–5.11)
RDW: 13.2 % (ref 11.5–15.5)
WBC: 5.3 K/uL (ref 4.0–10.5)

## 2024-08-14 MED ORDER — HYDROCORTISONE ACETATE 25 MG RE SUPP: 25.0000 mg | Freq: Two times a day (BID) | RECTAL | 0 refills | Status: AC

## 2024-08-14 MED ORDER — HYDROCORTISONE (PERIANAL) 2.5 % EX CREA: 1.0000 | TOPICAL_CREAM | Freq: Two times a day (BID) | CUTANEOUS | 2 refills | Status: DC

## 2024-08-14 NOTE — Patient Instructions (Addendum)
 Your provider has requested that you go to the basement level for lab work before leaving today. Press B on the elevator. The lab is located at the first door on the left as you exit the elevator.  You have been scheduled for a CT scan of the abdomen and pelvis at [facility], 1st floor Radiology. You are scheduled on 08/21/24 at 8:00 am. You should arrive 15 minutes prior to your appointment time for registration.    Please follow the written instructions below on the day of your exam:   1) Do not eat anything after 4:00 am (4 hours prior to your test)    You may take any medications as prescribed with a small amount of water, if necessary. If you take any of the following medications: METFORMIN, GLUCOPHAGE, GLUCOVANCE, AVANDAMET, RIOMET, FORTAMET, ACTOPLUS MET, JANUMET, GLUMETZA or METAGLIP, you MAY be asked to HOLD this medication 48 hours AFTER the exam.   The purpose of you drinking the oral contrast is to aid in the visualization of your intestinal tract. The contrast solution may cause some diarrhea. Depending on your individual set of symptoms, you may also receive an intravenous injection of x-ray contrast/dye. Plan on being at [facility] for 45 minutes or longer, depending on the type of exam you are having performed.   If you have any questions regarding your exam or if you need to reschedule, you may call [facility] at [phone] between the hours of 8:00 am and 5:00 pm, Monday-Friday.     Miralax is an osmotic laxative.  It only brings more water into the stool.  This is safe to take daily.  Can take up to 17 gram of miralax twice a day.  Mix with juice or coffee.  Start 1 capful at night for 3-4 days and reassess your response in 3-4 days.  You can increase and decrease the dose based on your response.  Remember, it can take up to 3-4 days to take effect OR for the effects to wear off.   I often pair this with benefiber in the morning to help assure the  stool is not too loose.   FIBER SUPPLEMENT You can do metamucil or fibercon once or twice a day but if this causes gas/bloating please switch to Benefiber or Citracel.  Fiber is good for constipation/diarrhea/irritable bowel syndrome.  It can also help with weight loss and can help lower your bad cholesterol (LDL).  Please do 1 TBSP in the morning in water, coffee, or tea.  It can take up to a month before you can see a difference with your bowel movements.  It is cheapest from [employer], [employer], [employer].   Toileting tips to help with your constipation - Drink at least 64-80 ounces of water/liquid per day. - Establish a time to try to move your bowels every day.  For many people, this is after a cup of coffee or after a meal such as breakfast. - Sit all of the way back on the toilet keeping your back fairly straight and while sitting up, try to rest the tops of your forearms on your upper thighs.   - Raising your feet with a step stool/squatty potty can be helpful to improve the angle that allows your stool to pass through the rectum. - Relax the rectum feeling it bulge toward the toilet water.  If you feel your rectum raising toward your body, you are contracting rather than relaxing. - Breathe in and slowly exhale.  Belly breath by expanding your belly towards your belly button. Keep belly expanded as you gently direct pressure down and back to the anus.  A low pitched GRRR sound can assist with increasing intra-abdominal pressure.  (Can also trying to blow on a pinwheel and make it move, this helps with the same belly breathing) - Repeat 3-4 times. If unsuccessful, contract the pelvic floor to restore normal tone and get off the toilet.  Avoid excessive straining. - To reduce excessive wiping by teaching your anus to normally contract, place hands on outer aspect of knees and resist knee movement outward.  Hold 5-10 second then place hands just inside of knees and resist inward movement of knees.   Hold 5 seconds.  Repeat a few times each way.  Go to the ER if unable to pass gas, severe AB pain, unable to hold down food, any shortness of breath of chest pain.   Please do sitz baths- these can be found at the pharmacy. It is a Chief Operating Officer that is put in your toliet.  Please increase fiber or add benefiber, increase water and increase acitivity.  Will send in hydrocoritsone suppository, cheapest with GOODRX from [employer], [employer], [employer] or [employer] if your insurance does not pay for it. If the hemorrhoid suppository sent in is too expensive you can do this over the counter trick.  Apply a pea size amount of generic prescription Anusol  HC cream that has been sent into your pharmacy to the tip of an over the counter PrepH suppository and insert rectally once every night for at least 7 nights.  If this does not improve there are procedures that can be done.   About Hemorrhoids  Hemorrhoids are swollen veins in the lower rectum and anus.  Also called piles, hemorrhoids are a common problem.  Hemorrhoids may be internal (inside the rectum) or external (around the anus).  Internal Hemorrhoids  Internal hemorrhoids are often painless, but they rarely cause bleeding.  The internal veins may stretch and fall down (prolapse) through the anus to the outside of the body.  The veins may then become irritated and painful.  External Hemorrhoids  External hemorrhoids can be easily seen or felt around the anal opening.  They are under the skin around the anus.  When the swollen veins are scratched or broken by straining, rubbing or wiping they sometimes bleed.  How Hemorrhoids Occur  Veins in the rectum and around the anus tend to swell under pressure.  Hemorrhoids can result from increased pressure in the veins of your anus or rectum.  Some sources of pressure are:  Straining to have a bowel movement because of constipation Waiting too long to have a bowel movement Coughing and sneezing  often Sitting for extended periods of time, including on the toilet Diarrhea Obesity Trauma or injury to the anus Some liver diseases Stress Family history of hemorrhoids Pregnancy  Pregnant women should try to avoid becoming constipated, because they are more likely to have hemorrhoids during pregnancy.  In the last trimester of pregnancy, the enlarged uterus may press on blood vessels and causes hemorrhoids.  In addition, the strain of childbirth sometimes causes hemorrhoids after the birth.  Symptoms of Hemorrhoids  Some symptoms of hemorrhoids include: Swelling and/or a tender lump around the anus Itching, mild burning and bleeding around the anus Painful bowel movements with or without constipation Bright red blood covering the stool, on toilet paper or in the toilet bowel.   Symptoms usually go away  within a few days.  Always talk to your doctor about any bleeding to make sure it is not from some other causes.  Diagnosing and Treating Hemorrhoids  Diagnosis is made by an examination by your healthcare provider.  Special test can be performed by your doctor.    Most cases of hemorrhoids can be treated with: High-fiber diet: Eat more high-fiber foods, which help prevent constipation.  Ask for more detailed fiber information on types and sources of fiber from your healthcare provider. Fluids: Drink plenty of water.  This helps soften bowel movements so they are easier to pass. Sitz baths and cold packs: Sitting in lukewarm water two or three times a day for 15 minutes cleases the anal area and may relieve discomfort.  If the water is too hot, swelling around the anus will get worse.  Placing a cloth-covered ice pack on the anus for ten minutes four times a day can also help reduce selling.  Gently pushing a prolapsed hemorrhoid back inside after the bath or ice pack can be helpful. Medications: For mild discomfort, your healthcare provider may suggest over-the-counter pain medication  or prescribe a cream or ointment for topical use.  The cream may contain witch hazel, zinc oxide or petroleum jelly.  Medicated suppositories are also a treatment option.  Always consult your doctor before applying medications or creams. Procedures and surgeries: There are also a number of procedures and surgeries to shrink or remove hemorrhoids in more serious cases.  Talk to your physician about these options.  You can often prevent hemorrhoids or keep them from becoming worse by maintaining a healthy lifestyle.  Eat a fiber-rich diet of fruits, vegetables and whole grains.  Also, drink plenty of water and exercise regularly.   2007, [employer] Doc.30  Here some information about pelvic floor dysfunction. This may be contributing to some of your symptoms. We will continue with our evaluation but I do want you to consider adding on fiber supplement with low-dose MiraLAX daily. We could also refer to pelvic floor physical therapy.   Pelvic Floor Dysfunction, Female Pelvic floor dysfunction (PFD) is a condition that results when the group of muscles and connective tissues that support the organs in the pelvis (pelvic floor muscles) do not work well. These muscles and their connections form a sling that supports the colon and bladder. In women, they also support the uterus. PFD causes pelvic floor muscles to be too weak, too tight, or both. In PFD, muscle movements are not coordinated. This may cause bowel or bladder problems. It may also cause pain. What are the causes? This condition may be caused by an injury to the pelvic area or by a weakening of pelvic muscles. This often results from pregnancy and childbirth or other types of strain. In many cases, the exact cause is not known. What increases the risk? The following factors may make you more likely to develop this condition: Having chronic bladder tissue inflammation (interstitial cystitis). Being an older person. Being  overweight. History of radiation treatment for cancer in the pelvic region. Previous pelvic surgery, such as removal of the uterus (hysterectomy). What are the signs or symptoms? Symptoms of this condition vary and may include: Bladder symptoms, such as: Trouble starting urination and emptying the bladder. Frequent urinary tract infections. Leaking urine when coughing, laughing, or exercising (stress incontinence). Having to pass urine urgently or frequently. Pain when passing urine. Bowel symptoms, such as: Constipation. Urgent or frequent bowel movements. Incomplete bowel movements. Painful bowel  movements. Leaking stool or gas. Unexplained genital or rectal pain. Genital or rectal muscle spasms. Low back pain. Other symptoms may include: A heavy, full, or aching feeling in the vagina. A bulge that protrudes into the vagina. Pain during or after sex. How is this diagnosed? This condition may be diagnosed based on: Your symptoms and medical history. A physical exam. During the exam, your health care provider may check your pelvic muscles for tightness, spasm, pain, or weakness. This may include a rectal exam and a pelvic exam. In some cases, you may have diagnostic tests, such as: Electrical muscle function tests. Urine flow testing. X-ray tests of bowel function. Ultrasound of the pelvic organs. How is this treated? Treatment for this condition depends on the symptoms. Treatment options include: Physical therapy. This may include Kegel exercises to help relax or strengthen the pelvic floor muscles. Biofeedback. This type of therapy provides feedback on how tight your pelvic floor muscles are so that you can learn to control them. Internal or external massage therapy. A treatment that involves electrical stimulation of the pelvic floor muscles to help control pain (transcutaneous electrical nerve stimulation, or TENS). Sound wave therapy (ultrasound) to reduce muscle  spasms. Medicines, such as: Muscle relaxants. Bladder control medicines. Surgery to reconstruct or support pelvic floor muscles may be an option if other treatments do not help. Follow these instructions at home: Activity Do your usual activities as told by your health care provider. Ask your health care provider if you should modify any activities. Do pelvic floor strengthening or relaxing exercises at home as told by your physical therapist. Lifestyle Maintain a healthy weight. Eat foods that are high in fiber, such as beans, whole grains, and fresh fruits and vegetables. Limit foods that are high in fat and processed sugars, such as fried or sweet foods. Manage stress with relaxation techniques such as yoga or meditation. General instructions If you have problems with leakage: Use absorbable pads or wear padded underwear. Wash frequently with mild soap. Keep your genital and anal area as clean and dry as possible. Ask your health care provider if you should try a barrier cream to prevent skin irritation. Take warm baths to relieve pelvic muscle tension or spasms. Take over-the-counter and prescription medicines only as told by your health care provider. Keep all follow-up visits. How is this prevented? The cause of PFD is not always known, but there are a few things you can do to reduce the risk of developing this condition, including: Staying at a healthy weight. Getting regular exercise. Managing stress. Contact a health care provider if: Your symptoms are not improving with home care. You have signs or symptoms of PFD that get worse at home. You develop new signs or symptoms. You have signs of a urinary tract infection, such as: Fever. Chills. Increased urinary frequency. A burning feeling when urinating. You have not had a bowel movement in 3 days (constipation). Summary Pelvic floor dysfunction results when the muscles and connective tissues in your pelvic floor do not  work well. These muscles and their connections form a sling that supports your colon and bladder. In women, they also support the uterus. PFD may be caused by an injury to the pelvic area or by a weakening of pelvic muscles. PFD causes pelvic floor muscles to be too weak, too tight, or a combination of both. Symptoms may vary from person to person. In most cases, PFD can be treated with physical therapies and medicines. Surgery may be an option  if other treatments do not help. This information is not intended to replace advice given to you by your health care provider. Make sure you discuss any questions you have with your health care provider. Document Revised: 03/29/2021 Document Reviewed: 03/29/2021 Elsevier Patient Education  2022 [employer].

## 2024-08-21 ENCOUNTER — Ambulatory Visit (HOSPITAL_COMMUNITY)
Admission: RE | Admit: 2024-08-21 | Discharge: 2024-08-21 | Disposition: A | Discharge status: Home / Self Care | Source: Ambulatory Visit | Attending: Physician Assistant | Admitting: Physician Assistant

## 2024-08-21 DIAGNOSIS — R1031 Right lower quadrant pain: Secondary | ICD-10-CM | POA: Diagnosis not present

## 2024-08-21 DIAGNOSIS — K5730 Diverticulosis of large intestine without perforation or abscess without bleeding: Secondary | ICD-10-CM | POA: Diagnosis not present

## 2024-08-21 DIAGNOSIS — R102 Pelvic and perineal pain: Secondary | ICD-10-CM | POA: Diagnosis not present

## 2024-08-21 MED ORDER — IOHEXOL 300 MG/ML  SOLN
100.0000 mL | Freq: Once | INTRAMUSCULAR | Status: AC | PRN
  Administered 2024-08-21: 100 mL via INTRAVENOUS

## 2024-08-21 MED ORDER — SODIUM CHLORIDE (PF) 0.9 % IJ SOLN
INTRAMUSCULAR | Status: AC
  Filled 2024-08-21: qty 50

## 2024-08-25 DIAGNOSIS — R7989 Other specified abnormal findings of blood chemistry: Secondary | ICD-10-CM | POA: Diagnosis not present

## 2024-08-25 DIAGNOSIS — M1991 Primary osteoarthritis, unspecified site: Secondary | ICD-10-CM | POA: Diagnosis not present

## 2024-08-25 DIAGNOSIS — Z111 Encounter for screening for respiratory tuberculosis: Secondary | ICD-10-CM | POA: Diagnosis not present

## 2024-08-25 DIAGNOSIS — M0579 Rheumatoid arthritis with rheumatoid factor of multiple sites without organ or systems involvement: Secondary | ICD-10-CM | POA: Diagnosis not present

## 2024-08-25 DIAGNOSIS — Z6824 Body mass index (BMI) 24.0-24.9, adult: Secondary | ICD-10-CM | POA: Diagnosis not present

## 2024-08-25 DIAGNOSIS — M810 Age-related osteoporosis without current pathological fracture: Secondary | ICD-10-CM | POA: Diagnosis not present

## 2024-09-02 DIAGNOSIS — E782 Mixed hyperlipidemia: Secondary | ICD-10-CM | POA: Diagnosis not present

## 2024-09-02 DIAGNOSIS — M810 Age-related osteoporosis without current pathological fracture: Secondary | ICD-10-CM | POA: Diagnosis not present

## 2024-09-11 ENCOUNTER — Other Ambulatory Visit: Payer: Self-pay | Admitting: Hematology and Oncology

## 2024-09-11 ENCOUNTER — Telehealth: Payer: Self-pay

## 2024-09-11 DIAGNOSIS — M810 Age-related osteoporosis without current pathological fracture: Secondary | ICD-10-CM

## 2024-09-11 NOTE — Telephone Encounter (Signed)
 Kelli Mosher,PA: Is she getting zometa someplace? It's on her med list. Thanks

## 2024-09-23 DIAGNOSIS — C50112 Malignant neoplasm of central portion of left female breast: Secondary | ICD-10-CM | POA: Diagnosis not present

## 2024-09-23 DIAGNOSIS — R92323 Mammographic fibroglandular density, bilateral breasts: Secondary | ICD-10-CM | POA: Diagnosis not present

## 2024-09-23 DIAGNOSIS — C50111 Malignant neoplasm of central portion of right female breast: Secondary | ICD-10-CM | POA: Diagnosis not present

## 2024-09-23 NOTE — Progress Notes (Unsigned)
 [facility]  [address] [place],  [place]  [address] [phone]  Clinic Day:  05/25/2024  Referring physician: Trinidad Hun, MD   HISTORY OF PRESENT ILLNESS:  Andrea Cunningham is an 83 y.o. female with stage IB (T1c N0 M0) triple negative breast cancer, status post a left breast lumpectomy in January 2025.  Despite receptor testing showing this tumor to be estrogen, progesterone and Her2/neu receptor negative, her Ki-67 score was only 5%.  She completed 4 cycles of adjuvant oral Xeloda  in April 2025.  She comes in today for routine follow-up.  Since her last visit, the patient has been doing well.  Her fatigue has dissipated since she has completed her oral Xeloda  chemotherapy.  Other than lateral left breast discomfort, she denies having any changes in her breasts which concern her for early disease recurrence.  PHYSICAL EXAM:  There were no vitals taken for this visit. Wt Readings from Last 3 Encounters:  08/14/24 133 lb (60.3 kg)  05/25/24 134 lb 3.2 oz (60.9 kg)  03/25/24 134 lb (60.8 kg)   There is no height or weight on file to calculate BMI. Performance status (ECOG): 0 - Asymptomatic Physical Exam Constitutional:      Appearance: Normal appearance.  HENT:     Mouth/Throat:     Pharynx: Oropharynx is clear. No oropharyngeal exudate.  Cardiovascular:     Rate and Rhythm: Normal rate and regular rhythm.     Heart sounds: No murmur heard.    No friction rub. No gallop.  Pulmonary:     Breath sounds: Normal breath sounds.  Chest:  Breasts:    Right: No swelling, bleeding, inverted nipple, mass, nipple discharge or skin change.     Left: No swelling, bleeding, inverted nipple, mass, nipple discharge or skin change.  Abdominal:     General: Bowel sounds are normal. There is no distension.     Palpations: Abdomen is soft. There is no mass.     Tenderness: There is no abdominal tenderness.  Musculoskeletal:        General: No tenderness.      Cervical back: Normal range of motion and neck supple.     Right lower leg: No edema.     Left lower leg: No edema.  Lymphadenopathy:     Cervical: No cervical adenopathy.     Right cervical: No superficial, deep or posterior cervical adenopathy.    Left cervical: No superficial, deep or posterior cervical adenopathy.     Upper Body:     Right upper body: No supraclavicular or axillary adenopathy.     Left upper body: No supraclavicular or axillary adenopathy.     Lower Body: No right inguinal adenopathy. No left inguinal adenopathy.  Skin:    Coloration: Skin is not jaundiced.     Findings: No lesion or rash.  Neurological:     General: No focal deficit present.     Mental Status: She is alert and oriented to person, place, and time. Mental status is at baseline.  Psychiatric:        Mood and Affect: Mood normal.        Behavior: Behavior normal.        Thought Content: Thought content normal.        Judgment: Judgment normal.    LABS:      Latest Ref Rng & Units 08/14/2024   10:33 AM 05/25/2024    9:25 AM 03/25/2024   10:56 AM  CBC  WBC 4.0 - 10.5 K/uL 5.3  4.6  7.0   Hemoglobin 12.0 - 15.0 g/dL 87.0  87.3  88.4   Hematocrit 36.0 - 46.0 % 38.5  37.5  34.0   Platelets 150.0 - 400.0 K/uL 171.0  164  168       Latest Ref Rng & Units 08/14/2024   10:33 AM 05/25/2024    9:25 AM 03/25/2024   10:56 AM  CMP  Glucose 70 - 99 mg/dL 90  97  893   BUN 6 - 23 mg/dL 9  9  7    Creatinine 0.40 - 1.20 mg/dL 9.15  9.17  9.21   Sodium 135 - 145 mEq/L 137  135  138   Potassium 3.5 - 5.1 mEq/L 4.9  4.2  3.9   Chloride 96 - 112 mEq/L 102  102  104   CO2 19 - 32 mEq/L 30  24  25    Calcium 8.4 - 10.5 mg/dL 9.5  9.6  8.9   Total Protein 6.0 - 8.3 g/dL 7.1  7.1  6.9   Total Bilirubin 0.2 - 1.2 mg/dL 0.9  0.6  0.5   Alkaline Phos 39 - 117 U/L 40  49  45   AST 0 - 37 U/L 21  29  27    ALT 0 - 35 U/L 13  15  13     ASSESSMENT & PLAN:   Andrea Cunningham is an 83 y.o. female with stage IB (T1c  N0 M0) triple negative breast cancer.  As mentioned previously, she completed her 4 cycles of oral Xeloda  therapy in April 2025.  Based upon her clinical breast exam today, the patient remains disease-free.  Clinically, she appears to be doing very well.  Moving forward, I will perform clinical breast exams every 4 months over these next few years to ensure there remains no obvious evidence of disease recurrence.  She will also continue to undergo yearly mammograms for her routine radiographic breast cancer surveillance.  I will see her back in October 2024 for her next clinical breast exam.  The patient understands all the plans discussed today and is in agreement with them.  Cayton Cuevas DELENA Kerns, MD

## 2024-09-24 ENCOUNTER — Inpatient Hospital Stay: Attending: Oncology | Admitting: Oncology

## 2024-09-24 ENCOUNTER — Telehealth: Payer: Self-pay | Admitting: Oncology

## 2024-09-24 VITALS — BP 151/67 | HR 85 | Temp 97.8°F | Resp 14 | Ht 61.0 in | Wt 135.7 lb

## 2024-09-24 DIAGNOSIS — C50412 Malignant neoplasm of upper-outer quadrant of left female breast: Secondary | ICD-10-CM

## 2024-09-24 DIAGNOSIS — Z853 Personal history of malignant neoplasm of breast: Secondary | ICD-10-CM | POA: Insufficient documentation

## 2024-09-24 DIAGNOSIS — Z9221 Personal history of antineoplastic chemotherapy: Secondary | ICD-10-CM | POA: Diagnosis not present

## 2024-09-24 DIAGNOSIS — Z171 Estrogen receptor negative status [ER-]: Secondary | ICD-10-CM

## 2024-09-24 NOTE — Telephone Encounter (Signed)
 Patient has been scheduled for follow-up visit per 09/24/24 LOS.  Pt aware of scheduled appt details.

## 2024-09-30 DIAGNOSIS — Z171 Estrogen receptor negative status [ER-]: Secondary | ICD-10-CM | POA: Diagnosis not present

## 2024-09-30 DIAGNOSIS — C50112 Malignant neoplasm of central portion of left female breast: Secondary | ICD-10-CM | POA: Diagnosis not present

## 2024-10-03 DIAGNOSIS — M810 Age-related osteoporosis without current pathological fracture: Secondary | ICD-10-CM | POA: Diagnosis not present

## 2024-10-03 DIAGNOSIS — E782 Mixed hyperlipidemia: Secondary | ICD-10-CM | POA: Diagnosis not present

## 2024-11-02 DIAGNOSIS — M810 Age-related osteoporosis without current pathological fracture: Secondary | ICD-10-CM | POA: Diagnosis not present

## 2024-11-02 DIAGNOSIS — E782 Mixed hyperlipidemia: Secondary | ICD-10-CM | POA: Diagnosis not present

## 2024-11-04 DIAGNOSIS — E782 Mixed hyperlipidemia: Secondary | ICD-10-CM | POA: Diagnosis not present

## 2024-11-17 DIAGNOSIS — L821 Other seborrheic keratosis: Secondary | ICD-10-CM | POA: Diagnosis not present

## 2025-01-25 ENCOUNTER — Inpatient Hospital Stay: Admitting: Oncology
